# Patient Record
Sex: Female | Born: 1980
Health system: Southern US, Community
[De-identification: ages and names within clinical notes are randomized; demographics above are authoritative.]

## PROBLEM LIST (undated history)

## (undated) DIAGNOSIS — R011 Cardiac murmur, unspecified: Secondary | ICD-10-CM

## (undated) DIAGNOSIS — D560 Alpha thalassemia: Secondary | ICD-10-CM

## (undated) DIAGNOSIS — J45909 Unspecified asthma, uncomplicated: Secondary | ICD-10-CM

## (undated) DIAGNOSIS — Z1371 Encounter for nonprocreative screening for genetic disease carrier status: Secondary | ICD-10-CM

## (undated) DIAGNOSIS — Z1502 Genetic susceptibility to malignant neoplasm of ovary: Secondary | ICD-10-CM

## (undated) DIAGNOSIS — G43909 Migraine, unspecified, not intractable, without status migrainosus: Secondary | ICD-10-CM

## (undated) DIAGNOSIS — Z1501 Genetic susceptibility to malignant neoplasm of breast: Secondary | ICD-10-CM

## (undated) HISTORY — DX: Unspecified asthma, uncomplicated: J45.909

## (undated) HISTORY — DX: Migraine, unspecified, not intractable, without status migrainosus: G43.909

## (undated) HISTORY — PX: NO PAST SURGERIES: SHX2092

---

## 2015-03-06 ENCOUNTER — Encounter (HOSPITAL_COMMUNITY): Payer: Self-pay | Admitting: Emergency Medicine

## 2015-03-06 ENCOUNTER — Emergency Department (HOSPITAL_COMMUNITY)
Admission: EM | Admit: 2015-03-06 | Discharge: 2015-03-06 | Disposition: A | Discharge status: Home / Self Care | Payer: 59 | Source: Home / Self Care | Attending: Emergency Medicine | Admitting: Emergency Medicine

## 2015-03-06 DIAGNOSIS — J029 Acute pharyngitis, unspecified: Secondary | ICD-10-CM | POA: Diagnosis not present

## 2015-03-06 DIAGNOSIS — B349 Viral infection, unspecified: Secondary | ICD-10-CM | POA: Diagnosis not present

## 2015-03-06 LAB — POCT RAPID STREP A: Streptococcus, Group A Screen (Direct): NEGATIVE

## 2015-03-06 MED ORDER — AMOXICILLIN 500 MG PO CAPS: 500.0000 mg | ORAL_CAPSULE | Freq: Two times a day (BID) | ORAL | Status: DC

## 2015-03-06 NOTE — ED Provider Notes (Signed)
CSN: [id number]     Arrival date & time 03/06/15  1806 History   First MD Initiated Contact with Patient 03/06/15 1845     Chief Complaint  Patient presents with  . URI   (Consider location/radiation/quality/duration/timing/severity/associated sxs/prior Treatment) HPI  She is a 34 year old woman here for evaluation of sore throat. She states she has had sore throat, body aches, headache, ear ache, and a dry cough for the last 3 days. She does report spitting some blood-tinged mucus. She states she has felt not great for more like 1-2 weeks. She has not taken any medications. She does not report fevers at home.  History reviewed. No pertinent past medical history. History reviewed. No pertinent past surgical history. No family history on file. History  Substance Use Topics  . Smoking status: Not on file  . Smokeless tobacco: Not on file  . Alcohol Use: Not on file   OB History    No data available     Review of Systems As in history of present illness Allergies  Review of patient's allergies indicates no known allergies.  Home Medications   Prior to Admission medications   Medication Sig Start Date End Date Taking? Authorizing Provider  amoxicillin (AMOXIL) 500 MG capsule Take 1 capsule (500 mg total) by mouth 2 (two) times daily. 03/06/15   Charm Rings, MD   BP 119/74 mmHg  Pulse 120  Temp(Src) 103 F (39.4 C) (Oral)  Resp 16  SpO2 100%  LMP 02/16/2015 Physical Exam  Constitutional: She is oriented to person, place, and time. She appears well-developed and well-nourished. No distress.  HENT:  Nose: Nose normal.  Mouth/Throat: No oropharyngeal exudate.  Bilateral ears with clear middle ear effusions. Oropharynx is mildly erythematous. The right tonsil appears friable. No exudate.  Neck: Neck supple.  Cardiovascular: Regular rhythm and normal heart sounds.   No murmur heard. Tachycardic, appropriate for fever  Pulmonary/Chest: Effort normal and breath sounds normal. No  respiratory distress. She has no wheezes. She has no rales.  Lymphadenopathy:    She has no cervical adenopathy.  Neurological: She is alert and oriented to person, place, and time.    ED Course  Procedures (including critical care time) Labs Review Labs Reviewed  POCT RAPID STREP A    Imaging Review No results found.   MDM   1. Pharyngitis   2. Viral illness    This is likely viral. Discussed symptomatic treatment. Prescription for amoxicillin given to be filled if no improvement in 2 days.    Charm Rings, MD 03/06/15 [phone]

## 2015-03-06 NOTE — Discharge Instructions (Signed)
You likely has a virus. Take Tylenol or ibuprofen as needed. Make sure you're drinking plenty of fluids. If your symptoms are not improving by Thursday, please fill the amoxicillin. Follow-up as needed.

## 2015-03-06 NOTE — ED Notes (Signed)
C/o cold sx onset 3 days Sx include ear pain, HA, BA, ST, dry cough Alert... No signs of acute distress.

## 2015-03-10 LAB — CULTURE, GROUP A STREP: Strep A Culture: NEGATIVE

## 2015-07-29 NOTE — L&D Delivery Note (Addendum)
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  APGAR: 8, 9; weight  .   Placenta status: 3 vessel cord. Membranes intact.   Anesthesia:  Epidural Episiotomy: None Lacerations: 2nd degree;Perineal Suture Repair: 2.0 vicryl Est. Blood Loss (mL):    Mom to postpartum.  Baby to Couplet care / Skin to Skin.  Circumcision discussed. Patient wants to be discharged after 24 hours.  Janine LimboSTRINGER,Lety Cullens V 03/08/2016, 5:32 PM

## 2015-08-14 DIAGNOSIS — R946 Abnormal results of thyroid function studies: Secondary | ICD-10-CM | POA: Diagnosis not present

## 2015-08-14 DIAGNOSIS — Z3480 Encounter for supervision of other normal pregnancy, unspecified trimester: Secondary | ICD-10-CM | POA: Diagnosis not present

## 2015-08-14 LAB — OB RESULTS CONSOLE RPR: RPR: NONREACTIVE

## 2015-08-14 LAB — OB RESULTS CONSOLE HEPATITIS B SURFACE ANTIGEN: HEP B S AG: NEGATIVE

## 2015-08-14 LAB — OB RESULTS CONSOLE ABO/RH: RH Type: POSITIVE

## 2015-08-14 LAB — OB RESULTS CONSOLE ANTIBODY SCREEN: Antibody Screen: NEGATIVE

## 2015-08-14 LAB — OB RESULTS CONSOLE RUBELLA ANTIBODY, IGM: Rubella: IMMUNE

## 2015-08-21 DIAGNOSIS — O26899 Other specified pregnancy related conditions, unspecified trimester: Secondary | ICD-10-CM | POA: Diagnosis not present

## 2015-08-21 DIAGNOSIS — Z3A09 9 weeks gestation of pregnancy: Secondary | ICD-10-CM | POA: Diagnosis not present

## 2015-08-27 ENCOUNTER — Other Ambulatory Visit: Payer: Self-pay | Admitting: Obstetrics and Gynecology

## 2015-08-27 ENCOUNTER — Other Ambulatory Visit (HOSPITAL_COMMUNITY)
Admission: RE | Admit: 2015-08-27 | Discharge: 2015-08-27 | Disposition: A | Discharge status: Home / Self Care | Payer: 59 | Source: Ambulatory Visit | Attending: Obstetrics and Gynecology | Admitting: Obstetrics and Gynecology

## 2015-08-27 DIAGNOSIS — Z3480 Encounter for supervision of other normal pregnancy, unspecified trimester: Secondary | ICD-10-CM | POA: Diagnosis not present

## 2015-08-27 DIAGNOSIS — Z01419 Encounter for gynecological examination (general) (routine) without abnormal findings: Secondary | ICD-10-CM | POA: Diagnosis not present

## 2015-08-27 DIAGNOSIS — Z1151 Encounter for screening for human papillomavirus (HPV): Secondary | ICD-10-CM | POA: Diagnosis not present

## 2015-08-27 DIAGNOSIS — D560 Alpha thalassemia: Secondary | ICD-10-CM | POA: Diagnosis not present

## 2015-08-27 DIAGNOSIS — R946 Abnormal results of thyroid function studies: Secondary | ICD-10-CM | POA: Diagnosis not present

## 2015-08-29 LAB — CYTOLOGY - PAP

## 2015-09-17 DIAGNOSIS — D560 Alpha thalassemia: Secondary | ICD-10-CM | POA: Diagnosis not present

## 2015-09-17 DIAGNOSIS — Z3482 Encounter for supervision of other normal pregnancy, second trimester: Secondary | ICD-10-CM | POA: Diagnosis not present

## 2015-09-17 MED FILL — SELECT-OB + DHA PACK: 29-1 & 250 | 30 days supply | Qty: 60 | Fill #0

## 2015-09-25 MED FILL — BUTALBITAL/APAP/CAFFEINE TB: 50-325-40 | 10 days supply | Qty: 30 | Fill #0

## 2015-10-15 DIAGNOSIS — Z3482 Encounter for supervision of other normal pregnancy, second trimester: Secondary | ICD-10-CM | POA: Diagnosis not present

## 2015-10-29 DIAGNOSIS — Z3A20 20 weeks gestation of pregnancy: Secondary | ICD-10-CM | POA: Diagnosis not present

## 2015-10-29 DIAGNOSIS — Z3482 Encounter for supervision of other normal pregnancy, second trimester: Secondary | ICD-10-CM | POA: Diagnosis not present

## 2015-10-29 DIAGNOSIS — Z36 Encounter for antenatal screening of mother: Secondary | ICD-10-CM | POA: Diagnosis not present

## 2015-12-10 DIAGNOSIS — Z3482 Encounter for supervision of other normal pregnancy, second trimester: Secondary | ICD-10-CM | POA: Diagnosis not present

## 2016-01-08 DIAGNOSIS — Z3482 Encounter for supervision of other normal pregnancy, second trimester: Secondary | ICD-10-CM | POA: Diagnosis not present

## 2016-01-08 DIAGNOSIS — Z23 Encounter for immunization: Secondary | ICD-10-CM | POA: Diagnosis not present

## 2016-01-08 DIAGNOSIS — O99012 Anemia complicating pregnancy, second trimester: Secondary | ICD-10-CM | POA: Diagnosis not present

## 2016-01-23 ENCOUNTER — Other Ambulatory Visit: Payer: Self-pay | Admitting: Obstetrics and Gynecology

## 2016-01-23 ENCOUNTER — Ambulatory Visit (HOSPITAL_COMMUNITY)
Admission: RE | Admit: 2016-01-23 | Discharge: 2016-01-23 | Disposition: A | Discharge status: Home / Self Care | Payer: 59 | Source: Ambulatory Visit | Attending: Obstetrics and Gynecology | Admitting: Obstetrics and Gynecology

## 2016-01-23 DIAGNOSIS — O26843 Uterine size-date discrepancy, third trimester: Secondary | ICD-10-CM | POA: Insufficient documentation

## 2016-01-23 DIAGNOSIS — Z36 Encounter for antenatal screening of mother: Secondary | ICD-10-CM | POA: Insufficient documentation

## 2016-01-23 DIAGNOSIS — Z3689 Encounter for other specified antenatal screening: Secondary | ICD-10-CM

## 2016-01-23 DIAGNOSIS — Z3A32 32 weeks gestation of pregnancy: Secondary | ICD-10-CM | POA: Insufficient documentation

## 2016-02-22 DIAGNOSIS — Z3483 Encounter for supervision of other normal pregnancy, third trimester: Secondary | ICD-10-CM | POA: Diagnosis not present

## 2016-02-22 LAB — OB RESULTS CONSOLE GBS: STREP GROUP B AG: NEGATIVE

## 2016-03-05 ENCOUNTER — Telehealth (HOSPITAL_COMMUNITY): Payer: Self-pay | Admitting: *Deleted

## 2016-03-05 NOTE — Telephone Encounter (Signed)
Preadmission screen  

## 2016-03-08 ENCOUNTER — Inpatient Hospital Stay (HOSPITAL_COMMUNITY): Payer: 59 | Admitting: Anesthesiology

## 2016-03-08 ENCOUNTER — Encounter (HOSPITAL_COMMUNITY): Payer: Self-pay | Admitting: *Deleted

## 2016-03-08 ENCOUNTER — Inpatient Hospital Stay (HOSPITAL_COMMUNITY)
Admission: AD | Admit: 2016-03-08 | Discharge: 2016-03-09 | DRG: 775 | Disposition: A | Discharge status: Home / Self Care | Payer: 59 | Source: Ambulatory Visit | Attending: Obstetrics and Gynecology | Admitting: Obstetrics and Gynecology

## 2016-03-08 DIAGNOSIS — Z8249 Family history of ischemic heart disease and other diseases of the circulatory system: Secondary | ICD-10-CM

## 2016-03-08 DIAGNOSIS — IMO0001 Reserved for inherently not codable concepts without codable children: Secondary | ICD-10-CM

## 2016-03-08 DIAGNOSIS — D560 Alpha thalassemia: Secondary | ICD-10-CM

## 2016-03-08 DIAGNOSIS — Z833 Family history of diabetes mellitus: Secondary | ICD-10-CM | POA: Diagnosis not present

## 2016-03-08 DIAGNOSIS — Z3A39 39 weeks gestation of pregnancy: Secondary | ICD-10-CM

## 2016-03-08 DIAGNOSIS — Z3483 Encounter for supervision of other normal pregnancy, third trimester: Secondary | ICD-10-CM | POA: Diagnosis present

## 2016-03-08 HISTORY — DX: Cardiac murmur, unspecified: R01.1

## 2016-03-08 HISTORY — DX: Alpha thalassemia: D56.0

## 2016-03-08 LAB — CBC
HCT: 33.9 % — ABNORMAL LOW (ref 36.0–46.0)
HEMOGLOBIN: 11.1 g/dL — AB (ref 12.0–15.0)
MCH: 22.1 pg — AB (ref 26.0–34.0)
MCHC: 32.7 g/dL (ref 30.0–36.0)
MCV: 67.5 fL — AB (ref 78.0–100.0)
PLATELETS: 166 10*3/uL (ref 150–400)
RBC: 5.02 MIL/uL (ref 3.87–5.11)
RDW: 14.8 % (ref 11.5–15.5)
WBC: 12 10*3/uL — AB (ref 4.0–10.5)

## 2016-03-08 LAB — TYPE AND SCREEN
ABO/RH(D): O POS
Antibody Screen: NEGATIVE

## 2016-03-08 LAB — ABO/RH: ABO/RH(D): O POS

## 2016-03-08 MED ORDER — OXYTOCIN BOLUS FROM INFUSION
500.0000 mL | Freq: Once | INTRAVENOUS | Status: AC
  Administered 2016-03-08: 500 mL via INTRAVENOUS

## 2016-03-08 MED ORDER — HYDROMORPHONE HCL 2 MG PO TABS: 2.0000 mg | ORAL_TABLET | Freq: Four times a day (QID) | ORAL | Status: DC | PRN

## 2016-03-08 MED ORDER — PHENYLEPHRINE 40 MCG/ML (10ML) SYRINGE FOR IV PUSH (FOR BLOOD PRESSURE SUPPORT)
80.0000 ug | PREFILLED_SYRINGE | INTRAVENOUS | Status: DC | PRN
  Filled 2016-03-08: qty 10
  Filled 2016-03-08: qty 5

## 2016-03-08 MED ORDER — DEXTROSE 5 % IV SOLN
2.5000 10*6.[IU] | INTRAVENOUS | Status: DC
  Filled 2016-03-08 (×2): qty 2.5

## 2016-03-08 MED ORDER — TETANUS-DIPHTH-ACELL PERTUSSIS 5-2.5-18.5 LF-MCG/0.5 IM SUSP: 0.5000 mL | Freq: Once | INTRAMUSCULAR | Status: DC

## 2016-03-08 MED ORDER — ONDANSETRON HCL 4 MG/2ML IJ SOLN: 4.0000 mg | Freq: Four times a day (QID) | INTRAMUSCULAR | Status: DC | PRN

## 2016-03-08 MED ORDER — SOD CITRATE-CITRIC ACID 500-334 MG/5ML PO SOLN: 30.0000 mL | ORAL | Status: DC | PRN

## 2016-03-08 MED ORDER — MEASLES, MUMPS & RUBELLA VAC ~~LOC~~ INJ
0.5000 mL | INJECTION | Freq: Once | SUBCUTANEOUS | Status: DC
  Filled 2016-03-08: qty 0.5

## 2016-03-08 MED ORDER — LACTATED RINGERS IV SOLN
INTRAVENOUS | Status: DC
  Administered 2016-03-08: 15:00:00 via INTRAVENOUS

## 2016-03-08 MED ORDER — IBUPROFEN 600 MG PO TABS
600.0000 mg | ORAL_TABLET | Freq: Four times a day (QID) | ORAL | Status: DC
  Administered 2016-03-08 – 2016-03-09 (×5): 600 mg via ORAL
  Filled 2016-03-08 (×5): qty 1

## 2016-03-08 MED ORDER — COCONUT OIL OIL
1.0000 | TOPICAL_OIL | Status: DC | PRN
Start: 2016-03-08 — End: 2016-03-09

## 2016-03-08 MED ORDER — PHENYLEPHRINE 40 MCG/ML (10ML) SYRINGE FOR IV PUSH (FOR BLOOD PRESSURE SUPPORT)
80.0000 ug | PREFILLED_SYRINGE | INTRAVENOUS | Status: DC | PRN
  Filled 2016-03-08: qty 5

## 2016-03-08 MED ORDER — ONDANSETRON HCL 4 MG PO TABS: 4.0000 mg | ORAL_TABLET | ORAL | Status: DC | PRN

## 2016-03-08 MED ORDER — DIPHENHYDRAMINE HCL 25 MG PO CAPS: 25.0000 mg | ORAL_CAPSULE | Freq: Four times a day (QID) | ORAL | Status: DC | PRN

## 2016-03-08 MED ORDER — MEDROXYPROGESTERONE ACETATE 150 MG/ML IM SUSP: 150.0000 mg | INTRAMUSCULAR | Status: DC | PRN

## 2016-03-08 MED ORDER — FENTANYL 2.5 MCG/ML BUPIVACAINE 1/10 % EPIDURAL INFUSION (WH - ANES)
14.0000 mL/h | INTRAMUSCULAR | Status: DC | PRN
Start: 2016-03-08 — End: 2016-03-09
  Administered 2016-03-08 (×2): 14 mL/h via EPIDURAL
  Filled 2016-03-08 (×2): qty 125

## 2016-03-08 MED ORDER — OXYCODONE-ACETAMINOPHEN 5-325 MG PO TABS: 1.0000 | ORAL_TABLET | ORAL | Status: DC | PRN

## 2016-03-08 MED ORDER — OXYCODONE-ACETAMINOPHEN 5-325 MG PO TABS: 2.0000 | ORAL_TABLET | ORAL | Status: DC | PRN

## 2016-03-08 MED ORDER — LACTATED RINGERS IV SOLN: 500.0000 mL | INTRAVENOUS | Status: DC | PRN

## 2016-03-08 MED ORDER — OXYTOCIN 40 UNITS IN LACTATED RINGERS INFUSION - SIMPLE MED
2.5000 [IU]/h | INTRAVENOUS | Status: DC
  Filled 2016-03-08: qty 1000

## 2016-03-08 MED ORDER — PRENATAL MULTIVITAMIN CH
1.0000 | ORAL_TABLET | Freq: Every day | ORAL | Status: DC
  Administered 2016-03-09: 1 via ORAL
  Filled 2016-03-08: qty 1

## 2016-03-08 MED ORDER — PENICILLIN G POTASSIUM 5000000 UNITS IJ SOLR
5.0000 10*6.[IU] | Freq: Once | INTRAVENOUS | Status: DC
  Filled 2016-03-08: qty 5

## 2016-03-08 MED ORDER — SENNOSIDES-DOCUSATE SODIUM 8.6-50 MG PO TABS
2.0000 | ORAL_TABLET | ORAL | Status: DC
  Administered 2016-03-09: 2 via ORAL
  Filled 2016-03-08: qty 2

## 2016-03-08 MED ORDER — LACTATED RINGERS IV SOLN
500.0000 mL | Freq: Once | INTRAVENOUS | Status: AC
  Administered 2016-03-08: 500 mL via INTRAVENOUS

## 2016-03-08 MED ORDER — CEFAZOLIN SODIUM-DEXTROSE 2-4 GM/100ML-% IV SOLN: 2.0000 g | Freq: Once | INTRAVENOUS | Status: DC

## 2016-03-08 MED ORDER — ZOLPIDEM TARTRATE 5 MG PO TABS: 5.0000 mg | ORAL_TABLET | Freq: Every evening | ORAL | Status: DC | PRN

## 2016-03-08 MED ORDER — EPHEDRINE 5 MG/ML INJ
10.0000 mg | INTRAVENOUS | Status: DC | PRN
  Filled 2016-03-08: qty 4

## 2016-03-08 MED ORDER — ONDANSETRON HCL 4 MG/2ML IJ SOLN: 4.0000 mg | INTRAMUSCULAR | Status: DC | PRN

## 2016-03-08 MED ORDER — FLEET ENEMA 7-19 GM/118ML RE ENEM: 1.0000 | ENEMA | RECTAL | Status: DC | PRN

## 2016-03-08 MED ORDER — DIPHENHYDRAMINE HCL 50 MG/ML IJ SOLN: 12.5000 mg | INTRAMUSCULAR | Status: DC | PRN

## 2016-03-08 MED ORDER — WITCH HAZEL-GLYCERIN EX PADS: 1.0000 "application " | MEDICATED_PAD | CUTANEOUS | Status: DC | PRN

## 2016-03-08 MED ORDER — LIDOCAINE HCL (PF) 1 % IJ SOLN
30.0000 mL | INTRAMUSCULAR | Status: AC | PRN
  Administered 2016-03-08: 5 mL via SUBCUTANEOUS
  Filled 2016-03-08: qty 30

## 2016-03-08 MED ORDER — BENZOCAINE-MENTHOL 20-0.5 % EX AERO
1.0000 "application " | INHALATION_SPRAY | CUTANEOUS | Status: DC | PRN
  Administered 2016-03-09: 1 via TOPICAL
  Filled 2016-03-08: qty 56

## 2016-03-08 MED ORDER — CEFAZOLIN IN D5W 1 GM/50ML IV SOLN: 1.0000 g | Freq: Three times a day (TID) | INTRAVENOUS | Status: DC

## 2016-03-08 MED ORDER — ACETAMINOPHEN 325 MG PO TABS: 650.0000 mg | ORAL_TABLET | ORAL | Status: DC | PRN

## 2016-03-08 MED ORDER — DIBUCAINE 1 % RE OINT: 1.0000 "application " | TOPICAL_OINTMENT | RECTAL | Status: DC | PRN

## 2016-03-08 MED ORDER — SIMETHICONE 80 MG PO CHEW: 80.0000 mg | CHEWABLE_TABLET | ORAL | Status: DC | PRN

## 2016-03-08 NOTE — H&P (Signed)
[facility] Admission History and Physical Exam for an Obstetrics Patient  [name] is a 35 y.o. female, G3P2002, at 66w0dgestation, who presents for active labor. No SROM. [facility].  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 See history below.  OB History    Gravida Para Term Preterm AB Living   '3 2 2 '$ 0 0 2   SAB TAB Ectopic Multiple Live Births   0 0 0 0 2      Past Medical History:  Diagnosis Date  . Alpha thalassemia (HRankin   . Heart murmur    MVP    Prescriptions Prior to Admission  Medication Sig Dispense Refill Last Dose  . Prenatal Vit-Fe Fumarate-FA (PRENATAL MULTIVITAMIN) TABS tablet Take 1 tablet by mouth daily.   Past Week at Unknown time    Past Surgical History:  Procedure Laterality Date  . NO PAST SURGERIES      No Known Allergies  Family History: family history includes Cancer in her maternal grandmother, mother, paternal grandfather, and paternal grandmother; Diabetes in her father; Heart disease in her maternal grandfather.  Social History:  reports that she has never smoked. She has never used smokeless tobacco. She reports that she does not drink alcohol or use drugs.  Review of systems: Normal pregnancy complaints.  Admission Physical Exam:  Dilation: 7 Effacement (%): 100 Station: -3 Exam by:: jolynn There is no height or weight on file to calculate BMI.  There were no vitals taken for this visit.   HEENT:                 Within normal limits Chest:                   Clear Heart:                    Regular rate and rhythm Abdomen:             Gravid and nontender Extremities:          Grossly normal Neurologic exam: Grossly normal Pelvic exam:         Cervix: 7 cm  Prenatal labs: ABO, Rh:   O+            HBsAg:         NR           HIV:              NR            GBS:             Neg          Antibody:       Neg         Rubella:         Immune RPR:            NR                Prenatal Transfer Tool  Maternal Diabetes: No Genetic Screening: Normal AFP, Declines other screen Maternal Ultrasounds/Referrals: Normal Fetal Ultrasounds or other Referrals:  None Maternal Substance Abuse:  No Significant Maternal Medications:  None Significant Maternal Lab Results:  None Other Comments:  None  Assessment:  324w0destation  Active Labor  Alpha Thal  Plan:  Vaginal delivery.   Harlen Danford V 03/08/2016, 3:00 PM

## 2016-03-08 NOTE — Discharge Instructions (Signed)
Contraception Choices Contraception (birth control) is the use of any methods or devices to prevent pregnancy. Below are some methods to help avoid pregnancy. HORMONAL METHODS   Contraceptive implant. This is a thin, plastic tube containing progesterone hormone. It does not contain estrogen hormone. Your health care provider inserts the tube in the inner part of the upper arm. The tube can remain in place for up to 3 years. After 3 years, the implant must be removed. The implant prevents the ovaries from releasing an egg (ovulation), thickens the cervical mucus to prevent sperm from entering the uterus, and thins the lining of the inside of the uterus.  Progesterone-only injections. These injections are given every 3 months by your health care provider to prevent pregnancy. This synthetic progesterone hormone stops the ovaries from releasing eggs. It also thickens cervical mucus and changes the uterine lining. This makes it harder for sperm to survive in the uterus.  Birth control pills. These pills contain estrogen and progesterone hormone. They work by preventing the ovaries from releasing eggs (ovulation). They also cause the cervical mucus to thicken, preventing the sperm from entering the uterus. Birth control pills are prescribed by a health care provider.Birth control pills can also be used to treat heavy periods.  Minipill. This type of birth control pill contains only the progesterone hormone. They are taken every day of each month and must be prescribed by your health care provider.  Birth control patch. The patch contains hormones similar to those in birth control pills. It must be changed once a week and is prescribed by a health care provider.  Vaginal ring. The ring contains hormones similar to those in birth control pills. It is left in the vagina for 3 weeks, removed for 1 week, and then a new one is put back in place. The patient must be comfortable inserting and removing the ring  from the vagina.A health care provider's prescription is necessary.  Emergency contraception. Emergency contraceptives prevent pregnancy after unprotected sexual intercourse. This pill can be taken right after sex or up to 5 days after unprotected sex. It is most effective the sooner you take the pills after having sexual intercourse. Most emergency contraceptive pills are available without a prescription. Check with your pharmacist. Do not use emergency contraception as your only form of birth control. BARRIER METHODS   Female condom. This is a thin sheath (latex or rubber) that is worn over the penis during sexual intercourse. It can be used with spermicide to increase effectiveness.  Female condom. This is a soft, loose-fitting sheath that is put into the vagina before sexual intercourse.  Diaphragm. This is a soft, latex, dome-shaped barrier that must be fitted by a health care provider. It is inserted into the vagina, along with a spermicidal jelly. It is inserted before intercourse. The diaphragm should be left in the vagina for 6 to 8 hours after intercourse.  Cervical cap. This is a round, soft, latex or plastic cup that fits over the cervix and must be fitted by a health care provider. The cap can be left in place for up to 48 hours after intercourse.  Sponge. This is a soft, circular piece of polyurethane foam. The sponge has spermicide in it. It is inserted into the vagina after wetting it and before sexual intercourse.  Spermicides. These are chemicals that kill or block sperm from entering the cervix and uterus. They come in the form of creams, jellies, suppositories, foam, or tablets. They do not require a  prescription. They are inserted into the vagina with an applicator before having sexual intercourse. The process must be repeated every time you have sexual intercourse. INTRAUTERINE CONTRACEPTION  Intrauterine device (IUD). This is a T-shaped device that is put in a woman's uterus  during a menstrual period to prevent pregnancy. There are 2 types:  Copper IUD. This type of IUD is wrapped in copper wire and is placed inside the uterus. Copper makes the uterus and fallopian tubes produce a fluid that kills sperm. It can stay in place for 10 years.  Hormone IUD. This type of IUD contains the hormone progestin (synthetic progesterone). The hormone thickens the cervical mucus and prevents sperm from entering the uterus, and it also thins the uterine lining to prevent implantation of a fertilized egg. The hormone can weaken or kill the sperm that get into the uterus. It can stay in place for 3-5 years, depending on which type of IUD is used. PERMANENT METHODS OF CONTRACEPTION  Female tubal ligation. This is when the woman's fallopian tubes are surgically sealed, tied, or blocked to prevent the egg from traveling to the uterus.  Hysteroscopic sterilization. This involves placing a small coil or insert into each fallopian tube. Your doctor uses a technique called hysteroscopy to do the procedure. The device causes scar tissue to form. This results in permanent blockage of the fallopian tubes, so the sperm cannot fertilize the egg. It takes about 3 months after the procedure for the tubes to become blocked. You must use another form of birth control for these 3 months.  Female sterilization. This is when the female has the tubes that carry sperm tied off (vasectomy).This blocks sperm from entering the vagina during sexual intercourse. After the procedure, the man can still ejaculate fluid (semen). NATURAL PLANNING METHODS  Natural family planning. This is not having sexual intercourse or using a barrier method (condom, diaphragm, cervical cap) on days the woman could become pregnant.  Calendar method. This is keeping track of the length of each menstrual cycle and identifying when you are fertile.  Ovulation method. This is avoiding sexual intercourse during ovulation.  Symptothermal  method. This is avoiding sexual intercourse during ovulation, using a thermometer and ovulation symptoms.  Post-ovulation method. This is timing sexual intercourse after you have ovulated. Regardless of which type or method of contraception you choose, it is important that you use condoms to protect against the transmission of sexually transmitted infections (STIs). Talk with your health care provider about which form of contraception is most appropriate for you.   This information is not intended to replace advice given to you by your health care provider. Make sure you discuss any questions you have with your health care provider.   Document Released: 07/14/2005 Document Revised: 07/19/2013 Document Reviewed: 01/06/2013 Elsevier Interactive Patient Education 2016 [employer] Breastfeeding and Mastitis Mastitis is inflammation of the breast tissue. It can occur in women who are breastfeeding. This can make breastfeeding painful. Mastitis will sometimes go away on its own. Your health care provider will help determine if treatment is needed. CAUSES Mastitis is often associated with a blocked milk (lactiferous) duct. This can happen when too much milk builds up in the breast. Causes of excess milk in the breast can include:  Poor latch-on. If your baby is not latched onto the breast properly, she or he may not empty your breast completely while breastfeeding.  Allowing too much time to pass between feedings.  Wearing a bra or other clothing that is  too tight. This puts extra pressure on the lactiferous ducts so milk does not flow through them as it should. Mastitis can also be caused by a bacterial infection. Bacteria may enter the breast tissue through cuts or openings in the skin. In women who are breastfeeding, this may occur because of cracked or irritated skin. Cracks in the skin are often caused when your baby does not latch on properly to the breast. SIGNS AND SYMPTOMS  Swelling,  redness, tenderness, and pain in an area of the breast.  Swelling of the glands under the arm on the same side.  Fever may or may not accompany mastitis. If an infection is allowed to progress, a collection of pus (abscess) may develop. DIAGNOSIS  Your health care provider can usually diagnose mastitis based on your symptoms and a physical exam. Tests may be done to help confirm the diagnosis. These may include:  Removal of pus from the breast by applying pressure to the area. This pus can be examined in the lab to determine which bacteria are present. If an abscess has developed, the fluid in the abscess can be removed with a needle. This can also be used to confirm the diagnosis and determine the bacteria present. In most cases, pus will not be present.  Blood tests to determine if your body is fighting a bacterial infection.  Mammogram or ultrasound tests to rule out other problems or diseases. TREATMENT  Mastitis that occurs with breastfeeding will sometimes go away on its own. Your health care provider may choose to wait 24 hours after first seeing you to decide whether a prescription medicine is needed. If your symptoms are worse after 24 hours, your health care provider will likely prescribe an antibiotic medicine to treat the mastitis. He or she will determine which bacteria are most likely causing the infection and will then select an appropriate antibiotic medicine. This is sometimes changed based on the results of tests performed to identify the bacteria, or if there is no response to the antibiotic medicine selected. Antibiotic medicines are usually given by mouth. You may also be given medicine for pain. HOME CARE INSTRUCTIONS  Only take over-the-counter or prescription medicines for pain, fever, or discomfort as directed by your health care provider.  If your health care provider prescribed an antibiotic medicine, take the medicine as directed. Make sure you finish it even if you  start to feel better.  Do not wear a tight or underwire bra. Wear a soft, supportive bra.  Increase your fluid intake, especially if you have a fever.  Continue to empty the breast. Your health care provider can tell you whether this milk is safe for your infant or needs to be thrown out. You may be told to stop nursing until your health care provider thinks it is safe for your baby. Use a breast pump if you are advised to stop nursing.  Keep your nipples clean and dry.  Empty the first breast completely before going to the other breast. If your baby is not emptying your breasts completely for some reason, use a breast pump to empty your breasts.  If you go back to work, pump your breasts while at work to stay in time with your nursing schedule.  Avoid allowing your breasts to become overly filled with milk (engorged). SEEK MEDICAL CARE IF:  You have pus-like discharge from the breast.  Your symptoms do not improve with the treatment prescribed by your health care provider within 2 days. SEEK IMMEDIATE  MEDICAL CARE IF:  Your pain and swelling are getting worse.  You have pain that is not controlled with medicine.  You have a red line extending from the breast toward your armpit.  You have a fever or persistent symptoms for more than 2-3 days.  You have a fever and your symptoms suddenly get worse. MAKE SURE YOU:   Understand these instructions.  Will watch your condition.  Will get help right away if you are not doing well or get worse.   This information is not intended to replace advice given to you by your health care provider. Make sure you discuss any questions you have with your health care provider.   Document Released: 11/08/2004 Document Revised: 07/19/2013 Document Reviewed: 02/17/2013 Elsevier Interactive Patient Education [employer]. Breastfeeding Deciding to breastfeed is one of the best choices you can make for you and your baby. A change in hormones  during pregnancy causes your breast tissue to grow and increases the number and size of your milk ducts. These hormones also allow proteins, sugars, and fats from your blood supply to make breast milk in your milk-producing glands. Hormones prevent breast milk from being released before your baby is born as well as prompt milk flow after birth. Once breastfeeding has begun, thoughts of your baby, as well as his or her sucking or crying, can stimulate the release of milk from your milk-producing glands.  BENEFITS OF BREASTFEEDING For Your Baby  Your first milk (colostrum) helps your baby's digestive system function better.  There are antibodies in your milk that help your baby fight off infections.  Your baby has a lower incidence of asthma, allergies, and sudden infant death syndrome.  The nutrients in breast milk are better for your baby than infant formulas and are designed uniquely for your baby's needs.  Breast milk improves your baby's brain development.  Your baby is less likely to develop other conditions, such as childhood obesity, asthma, or type 2 diabetes mellitus. For You  Breastfeeding helps to create a very special bond between you and your baby.  Breastfeeding is convenient. Breast milk is always available at the correct temperature and costs nothing.  Breastfeeding helps to burn calories and helps you lose the weight gained during pregnancy.  Breastfeeding makes your uterus contract to its prepregnancy size faster and slows bleeding (lochia) after you give birth.   Breastfeeding helps to lower your risk of developing type 2 diabetes mellitus, osteoporosis, and breast or ovarian cancer later in life. SIGNS THAT YOUR BABY IS HUNGRY Early Signs of Hunger  Increased alertness or activity.  Stretching.  Movement of the head from side to side.  Movement of the head and opening of the mouth when the corner of the mouth or cheek is stroked (rooting).  Increased sucking  sounds, smacking lips, cooing, sighing, or squeaking.  Hand-to-mouth movements.  Increased sucking of fingers or hands. Late Signs of Hunger  Fussing.  Intermittent crying. Extreme Signs of Hunger Signs of extreme hunger will require calming and consoling before your baby will be able to breastfeed successfully. Do not wait for the following signs of extreme hunger to occur before you initiate breastfeeding:  Restlessness.  A loud, strong cry.  Screaming. BREASTFEEDING BASICS Breastfeeding Initiation  Find a comfortable place to sit or lie down, with your neck and back well supported.  Place a pillow or rolled up blanket under your baby to bring him or her to the level of your breast (if you are  seated). Nursing pillows are specially designed to help support your arms and your baby while you breastfeed.  Make sure that your baby's abdomen is facing your abdomen.  Gently massage your breast. With your fingertips, massage from your chest wall toward your nipple in a circular motion. This encourages milk flow. You may need to continue this action during the feeding if your milk flows slowly.  Support your breast with 4 fingers underneath and your thumb above your nipple. Make sure your fingers are well away from your nipple and your baby's mouth.  Stroke your baby's lips gently with your finger or nipple.  When your baby's mouth is open wide enough, quickly bring your baby to your breast, placing your entire nipple and as much of the colored area around your nipple (areola) as possible into your baby's mouth.  More areola should be visible above your baby's upper lip than below the lower lip.  Your baby's tongue should be between his or her lower gum and your breast.  Ensure that your baby's mouth is correctly positioned around your nipple (latched). Your baby's lips should create a seal on your breast and be turned out (everted).  It is common for your baby to suck about 2-3  minutes in order to start the flow of breast milk. Latching Teaching your baby how to latch on to your breast properly is very important. An improper latch can cause nipple pain and decreased milk supply for you and poor weight gain in your baby. Also, if your baby is not latched onto your nipple properly, he or she may swallow some air during feeding. This can make your baby fussy. Burping your baby when you switch breasts during the feeding can help to get rid of the air. However, teaching your baby to latch on properly is still the best way to prevent fussiness from swallowing air while breastfeeding. Signs that your baby has successfully latched on to your nipple:  Silent tugging or silent sucking, without causing you pain.  Swallowing heard between every 3-4 sucks.  Muscle movement above and in front of his or her ears while sucking. Signs that your baby has not successfully latched on to nipple:  Sucking sounds or smacking sounds from your baby while breastfeeding.  Nipple pain. If you think your baby has not latched on correctly, slip your finger into the corner of your baby's mouth to break the suction and place it between your baby's gums. Attempt breastfeeding initiation again. Signs of Successful Breastfeeding Signs from your baby:  A gradual decrease in the number of sucks or complete cessation of sucking.  Falling asleep.  Relaxation of his or her body.  Retention of a small amount of milk in his or her mouth.  Letting go of your breast by himself or herself. Signs from you:  Breasts that have increased in firmness, weight, and size 1-3 hours after feeding.  Breasts that are softer immediately after breastfeeding.  Increased milk volume, as well as a change in milk consistency and color by the fifth day of breastfeeding.  Nipples that are not sore, cracked, or bleeding. Signs That Your [employer] is Getting Enough Milk  Wetting at least 3 diapers in a 24-hour period. The  urine should be clear and pale yellow by age 105 days.  At least 3 stools in a 24-hour period by age 105 days. The stool should be soft and yellow.  At least 3 stools in a 24-hour period by age 105 days. The  stool should be seedy and yellow.  No loss of weight greater than 10% of birth weight during the first 34 days of age.  Average weight gain of 4-7 ounces (113-198 g) per week after age 18 days.  Consistent daily weight gain by age 7 days, without weight loss after the age of 2 weeks. After a feeding, your baby may spit up a small amount. This is common. BREASTFEEDING FREQUENCY AND DURATION Frequent feeding will help you make more milk and can prevent sore nipples and breast engorgement. Breastfeed when you feel the need to reduce the fullness of your breasts or when your baby shows signs of hunger. This is called "breastfeeding on demand." Avoid introducing a pacifier to your baby while you are working to establish breastfeeding (the first 4-6 weeks after your baby is born). After this time you may choose to use a pacifier. Research has shown that pacifier use during the first year of a baby's life decreases the risk of sudden infant death syndrome (SIDS). Allow your baby to feed on each breast as long as he or she wants. Breastfeed until your baby is finished feeding. When your baby unlatches or falls asleep while feeding from the first breast, offer the second breast. Because newborns are often sleepy in the first few weeks of life, you may need to awaken your baby to get him or her to feed. Breastfeeding times will vary from baby to baby. However, the following rules can serve as a guide to help you ensure that your baby is properly fed:  Newborns (babies 11 weeks of age or younger) may breastfeed every 1-3 hours.  Newborns should not go longer than 3 hours during the day or 5 hours during the night without breastfeeding.  You should breastfeed your baby a minimum of 8 times in a 24-hour period  until you begin to introduce solid foods to your baby at around 37 months of age. BREAST MILK PUMPING Pumping and storing breast milk allows you to ensure that your baby is exclusively fed your breast milk, even at times when you are unable to breastfeed. This is especially important if you are going back to work while you are still breastfeeding or when you are not able to be present during feedings. Your lactation consultant can give you guidelines on how long it is safe to store breast milk. A breast pump is a machine that allows you to pump milk from your breast into a sterile bottle. The pumped breast milk can then be stored in a refrigerator or freezer. Some breast pumps are operated by hand, while others use electricity. Ask your lactation consultant which type will work best for you. Breast pumps can be purchased, but some hospitals and breastfeeding support groups lease breast pumps on a monthly basis. A lactation consultant can teach you how to hand express breast milk, if you prefer not to use a pump. CARING FOR YOUR BREASTS WHILE YOU BREASTFEED Nipples can become dry, cracked, and sore while breastfeeding. The following recommendations can help keep your breasts moisturized and healthy:  Avoid using soap on your nipples.  Wear a supportive bra. Although not required, special nursing bras and tank tops are designed to allow access to your breasts for breastfeeding without taking off your entire bra or top. Avoid wearing underwire-style bras or extremely tight bras.  Air dry your nipples for 3-43mnutes after each feeding.  Use only cotton bra pads to absorb leaked breast milk. Leaking of breast milk between feedings  is normal.  Use lanolin on your nipples after breastfeeding. Lanolin helps to maintain your skin's normal moisture barrier. If you use pure lanolin, you do not need to wash it off before feeding your baby again. Pure lanolin is not toxic to your baby. You may also hand express a  few drops of breast milk and gently massage that milk into your nipples and allow the milk to air dry. In the first few weeks after giving birth, some women experience extremely full breasts (engorgement). Engorgement can make your breasts feel heavy, warm, and tender to the touch. Engorgement peaks within 3-5 days after you give birth. The following recommendations can help ease engorgement:  Completely empty your breasts while breastfeeding or pumping. You may want to start by applying warm, moist heat (in the shower or with warm water-soaked hand towels) just before feeding or pumping. This increases circulation and helps the milk flow. If your baby does not completely empty your breasts while breastfeeding, pump any extra milk after he or she is finished.  Wear a snug bra (nursing or regular) or tank top for 1-2 days to signal your body to slightly decrease milk production.  Apply ice packs to your breasts, unless this is too uncomfortable for you.  Make sure that your baby is latched on and positioned properly while breastfeeding. If engorgement persists after 48 hours of following these recommendations, contact your health care provider or a Science writer. OVERALL HEALTH CARE RECOMMENDATIONS WHILE BREASTFEEDING  Eat healthy foods. Alternate between meals and snacks, eating 3 of each per day. Because what you eat affects your breast milk, some of the foods may make your baby more irritable than usual. Avoid eating these foods if you are sure that they are negatively affecting your baby.  Drink milk, fruit juice, and water to satisfy your thirst (about 10 glasses a day).  Rest often, relax, and continue to take your prenatal vitamins to prevent fatigue, stress, and anemia.  Continue breast self-awareness checks.  Avoid chewing and smoking tobacco. Chemicals from cigarettes that pass into breast milk and exposure to secondhand smoke may harm your baby.  Avoid alcohol and drug use,  including marijuana. Some medicines that may be harmful to your baby can pass through breast milk. It is important to ask your health care provider before taking any medicine, including all over-the-counter and prescription medicine as well as vitamin and herbal supplements. It is possible to become pregnant while breastfeeding. If birth control is desired, ask your health care provider about options that will be safe for your baby. SEEK MEDICAL CARE IF:  You feel like you want to stop breastfeeding or have become frustrated with breastfeeding.  You have painful breasts or nipples.  Your nipples are cracked or bleeding.  Your breasts are red, tender, or warm.  You have a swollen area on either breast.  You have a fever or chills.  You have nausea or vomiting.  You have drainage other than breast milk from your nipples.  Your breasts do not become full before feedings by the fifth day after you give birth.  You feel sad and depressed.  Your baby is too sleepy to eat well.  Your baby is having trouble sleeping.   Your baby is wetting less than 3 diapers in a 24-hour period.  Your baby has less than 3 stools in a 24-hour period.  Your baby's skin or the white part of his or her eyes becomes yellow.   Your baby is  not gaining weight by 69 days of age. SEEK IMMEDIATE MEDICAL CARE IF:  Your baby is overly tired (lethargic) and does not want to wake up and feed.  Your baby develops an unexplained fever.   This information is not intended to replace advice given to you by your health care provider. Make sure you discuss any questions you have with your health care provider.   Document Released: 07/14/2005 Document Revised: 04/04/2015 Document Reviewed: 01/05/2013 Elsevier Interactive Patient Education 2016 [employer]. Anemia, Nonspecific Anemia is a condition in which the concentration of red blood cells or hemoglobin in the blood is below normal. Hemoglobin is a substance  in red blood cells that carries oxygen to the tissues of the body. Anemia results in not enough oxygen reaching these tissues.  CAUSES  Common causes of anemia include:   Excessive bleeding. Bleeding may be internal or external. This includes excessive bleeding from periods (in women) or from the intestine.   Poor nutrition.   Chronic kidney, thyroid, and liver disease.  Bone marrow disorders that decrease red blood cell production.  Cancer and treatments for cancer.  HIV, AIDS, and their treatments.  Spleen problems that increase red blood cell destruction.  Blood disorders.  Excess destruction of red blood cells due to infection, medicines, and autoimmune disorders. SIGNS AND SYMPTOMS   Minor weakness.   Dizziness.   Headache.  Palpitations.   Shortness of breath, especially with exercise.   Paleness.  Cold sensitivity.  Indigestion.  Nausea.  Difficulty sleeping.  Difficulty concentrating. Symptoms may occur suddenly or they may develop slowly.  DIAGNOSIS  Additional blood tests are often needed. These help your health care provider determine the best treatment. Your health care provider will check your stool for blood and look for other causes of blood loss.  TREATMENT  Treatment varies depending on the cause of the anemia. Treatment can include:   Supplements of iron, vitamin O27, or folic acid.   Hormone medicines.   A blood transfusion. This may be needed if blood loss is severe.   Hospitalization. This may be needed if there is significant continual blood loss.   Dietary changes.  Spleen removal. HOME CARE INSTRUCTIONS Keep all follow-up appointments. It often takes many weeks to correct anemia, and having your health care provider check on your condition and your response to treatment is very important. SEEK IMMEDIATE MEDICAL CARE IF:   You develop extreme weakness, shortness of breath, or chest pain.   You become dizzy or have  trouble concentrating.  You develop heavy vaginal bleeding.   You develop a rash.   You have bloody or black, tarry stools.   You faint.   You vomit up blood.   You vomit repeatedly.   You have abdominal pain.  You have a fever or persistent symptoms for more than 2-3 days.   You have a fever and your symptoms suddenly get worse.   You are dehydrated.  MAKE SURE YOU:  Understand these instructions.  Will watch your condition.  Will get help right away if you are not doing well or get worse.   This information is not intended to replace advice given to you by your health care provider. Make sure you discuss any questions you have with your health care provider.   Document Released: 08/21/2004 Document Revised: 03/16/2013 Document Reviewed: 01/07/2013 Elsevier Interactive Patient Education 2016 [employer]. Postpartum Depression and Baby Blues The postpartum period begins right after the birth of a baby. During this  time, there is often a great amount of joy and excitement. It is also a time of many changes in the life of the parents. Regardless of how many times a mother gives birth, each child brings new challenges and dynamics to the family. It is not unusual to have feelings of excitement along with confusing shifts in moods, emotions, and thoughts. All mothers are at risk of developing postpartum depression or the "baby blues." These mood changes can occur right after giving birth, or they may occur many months after giving birth. The baby blues or postpartum depression can be mild or severe. Additionally, postpartum depression can go away rather quickly, or it can be a long-term condition.  CAUSES Raised hormone levels and the rapid drop in those levels are thought to be a main cause of postpartum depression and the baby blues. A number of hormones change during and after pregnancy. Estrogen and progesterone usually decrease right after the delivery of your baby.  The levels of thyroid hormone and various cortisol steroids also rapidly drop. Other factors that play a role in these mood changes include major life events and genetics.  RISK FACTORS If you have any of the following risks for the baby blues or postpartum depression, know what symptoms to watch out for during the postpartum period. Risk factors that may increase the likelihood of getting the baby blues or postpartum depression include:  Having a personal or family history of depression.   Having depression while being pregnant.   Having premenstrual mood issues or mood issues related to oral contraceptives.  Having a lot of life stress.   Having marital conflict.   Lacking a social support network.   Having a baby with special needs.   Having health problems, such as diabetes.  SIGNS AND SYMPTOMS Symptoms of baby blues include:  Brief changes in mood, such as going from extreme happiness to sadness.  Decreased concentration.   Difficulty sleeping.   Crying spells, tearfulness.   Irritability.   Anxiety.  Symptoms of postpartum depression typically begin within the first month after giving birth. These symptoms include:  Difficulty sleeping or excessive sleepiness.   Marked weight loss.   Agitation.   Feelings of worthlessness.   Lack of interest in activity or food.  Postpartum psychosis is a very serious condition and can be dangerous. Fortunately, it is rare. Displaying any of the following symptoms is cause for immediate medical attention. Symptoms of postpartum psychosis include:   Hallucinations and delusions.   Bizarre or disorganized behavior.   Confusion or disorientation.  DIAGNOSIS  A diagnosis is made by an evaluation of your symptoms. There are no medical or lab tests that lead to a diagnosis, but there are various questionnaires that a health care provider may use to identify those with the baby blues, postpartum depression, or  psychosis. Often, a screening tool called the [place] Postnatal Depression Scale is used to diagnose depression in the postpartum period.  TREATMENT The baby blues usually goes away on its own in 1-2 weeks. Social support is often all that is needed. You will be encouraged to get adequate sleep and rest. Occasionally, you may be given medicines to help you sleep.  Postpartum depression requires treatment because it can last several months or longer if it is not treated. Treatment may include individual or group therapy, medicine, or both to address any social, physiological, and psychological factors that may play a role in the depression. Regular exercise, a healthy diet, rest, and social  support may also be strongly recommended.  Postpartum psychosis is more serious and needs treatment right away. Hospitalization is often needed. HOME CARE INSTRUCTIONS  Get as much rest as you can. Nap when the baby sleeps.   Exercise regularly. Some women find yoga and walking to be beneficial.   Eat a balanced and nourishing diet.   Do little things that you enjoy. Have a cup of tea, take a bubble bath, read your favorite magazine, or listen to your favorite music.  Avoid alcohol.   Ask for help with household chores, cooking, grocery shopping, or running errands as needed. Do not try to do everything.   Talk to people close to you about how you are feeling. Get support from your partner, family members, friends, or other new moms.  Try to stay positive in how you think. Think about the things you are grateful for.   Do not spend a lot of time alone.   Only take over-the-counter or prescription medicine as directed by your health care provider.  Keep all your postpartum appointments.   Let your health care provider know if you have any concerns.  SEEK MEDICAL CARE IF: You are having a reaction to or problems with your medicine. SEEK IMMEDIATE MEDICAL CARE IF:  You have suicidal  feelings.   You think you may harm the baby or someone else. MAKE SURE YOU:  Understand these instructions.  Will watch your condition.  Will get help right away if you are not doing well or get worse.   This information is not intended to replace advice given to you by your health care provider. Make sure you discuss any questions you have with your health care provider.   Document Released: 04/17/2004 Document Revised: 07/19/2013 Document Reviewed: 04/25/2013 Elsevier Interactive Patient Education 2016 [employer]. Iron-Rich Diet Iron is a mineral that helps your body to produce hemoglobin. Hemoglobin is a protein in your red blood cells that carries oxygen to your body's tissues. Eating too little iron may cause you to feel weak and tired, and it can increase your risk for infection. Eating enough iron is necessary for your body's metabolism, muscle function, and nervous system. Iron is naturally found in many foods. It can also be added to foods or fortified in foods. There are two types of dietary iron:  Heme iron. Heme iron is absorbed by the body more easily than nonheme iron. Heme iron is found in meat, poultry, and fish.  Nonheme iron. Nonheme iron is found in dietary supplements, iron-fortified grains, beans, and vegetables. You may need to follow an iron-rich diet if:  You have been diagnosed with iron deficiency or iron-deficiency anemia.  You have a condition that prevents you from absorbing dietary iron, such as:  Infection in your intestines.  Celiac disease. This involves long-lasting (chronic) inflammation of your intestines.  You do not eat enough iron.  You eat a diet that is high in foods that impair iron absorption.  You have lost a lot of blood.  You have heavy bleeding during your menstrual cycle.  You are pregnant. WHAT IS MY PLAN? Your health care provider may help you to determine how much iron you need per day based on your condition. Generally,  when a person consumes sufficient amounts of iron in the diet, the following iron needs are met:  Men.  50-58 years old: 11 mg per day.  74-27 years old: 8 mg per day.  Women.   26-92 years old: 15 mg  per day.  12-47 years old: 18 mg per day.  Over 27 years old: 8 mg per day.  Pregnant women: 27 mg per day.  Breastfeeding women: 9 mg per day. WHAT DO I NEED TO KNOW ABOUT AN IRON-RICH DIET?  Eat fresh fruits and vegetables that are high in vitamin C along with foods that are high in iron. This will help increase the amount of iron that your body absorbs from food, especially with foods containing nonheme iron. Foods that are high in vitamin C include oranges, peppers, tomatoes, and mango.  Take iron supplements only as directed by your health care provider. Overdose of iron can be life-threatening. If you were prescribed iron supplements, take them with orange juice or a vitamin C supplement.  Cook foods in pots and pans that are made from iron.   Eat nonheme iron-containing foods alongside foods that are high in heme iron. This helps to improve your iron absorption.   Certain foods and drinks contain compounds that impair iron absorption. Avoid eating these foods in the same meal as iron-rich foods or with iron supplements. These include:  Coffee, black tea, and red wine.  Milk, dairy products, and foods that are high in calcium.  Beans, soybeans, and peas.  Whole grains.  When eating foods that contain both nonheme iron and compounds that impair iron absorption, follow these tips to absorb iron better.   Soak beans overnight before cooking.  Soak whole grains overnight and drain them before using.  Ferment flours before baking, such as using yeast in bread dough. WHAT FOODS CAN I EAT? Grains Iron-fortified breakfast cereal. Iron-fortified whole-wheat bread. Enriched rice. Sprouted grains. Vegetables Spinach. Potatoes with skin. Green peas. Broccoli. Red and  green bell peppers. Fermented vegetables. Fruits Prunes. Raisins. Oranges. Strawberries. Mango. Grapefruit. Meats and Other Protein Sources Beef liver. Oysters. Beef. Shrimp. [place]. Chicken. [place]. Sardines. Chickpeas. Nuts. Tofu. Beverages Tomato juice. Fresh orange juice. Prune juice. Hibiscus tea. Fortified instant breakfast shakes. Condiments Tahini. Fermented soy sauce. Sweets and Desserts Black-strap molasses.  Other Wheat germ. The items listed above may not be a complete list of recommended foods or beverages. Contact your dietitian for more options. WHAT FOODS ARE NOT RECOMMENDED? Grains Whole grains. Bran cereal. Bran flour. Oats. Vegetables Artichokes. Brussels sprouts. Kale. Fruits Blueberries. Raspberries. Strawberries. Figs. Meats and Other Protein Sources Soybeans. Products made from soy protein. Dairy Milk. Cream. Cheese. Yogurt. Cottage cheese. Beverages Coffee. Black tea. Red wine. Sweets and Desserts Cocoa. Chocolate. Ice cream. Other Basil. Oregano. Parsley. The items listed above may not be a complete list of foods and beverages to avoid. Contact your dietitian for more information.   This information is not intended to replace advice given to you by your health care provider. Make sure you discuss any questions you have with your health care provider.   Document Released: 02/25/2005 Document Revised: 08/04/2014 Document Reviewed: 02/08/2014 Elsevier Interactive Patient Education 2016 [place]. Postpartum Care After Vaginal Delivery After you deliver your newborn (postpartum period), the usual stay in the hospital is 24-72 hours. If there were problems with your labor or delivery, or if you have other medical problems, you might be in the hospital longer.  While you are in the hospital, you will receive help and instructions on how to care for yourself and your newborn during the postpartum period.  While you are in the  hospital:  Be sure to tell your nurses if you have pain or discomfort, as well as where you feel  the pain and what makes the pain worse.  If you had an incision made near your vagina (episiotomy) or if you had some tearing during delivery, the nurses may put ice packs on your episiotomy or tear. The ice packs may help to reduce the pain and swelling.  If you are breastfeeding, you may feel uncomfortable contractions of your uterus for a couple of weeks. This is normal. The contractions help your uterus get back to normal size.  It is normal to have some bleeding after delivery.  For the first 1-3 days after delivery, the flow is red and the amount may be similar to a period.  It is common for the flow to start and stop.  In the first few days, you may pass some small clots. Let your nurses know if you begin to pass large clots or your flow increases.  Do not  flush blood clots down the toilet before having the nurse look at them.  During the next 3-10 days after delivery, your flow should become more watery and pink or brown-tinged in color.  Ten to fourteen days after delivery, your flow should be a small amount of yellowish-white discharge.  The amount of your flow will decrease over the first few weeks after delivery. Your flow may stop in 6-8 weeks. Most women have had their flow stop by 12 weeks after delivery.  You should change your sanitary pads frequently.  Wash your hands thoroughly with soap and water for at least 20 seconds after changing pads, using the toilet, or before holding or feeding your newborn.  You should feel like you need to empty your bladder within the first 6-8 hours after delivery.  In case you become weak, lightheaded, or faint, call your nurse before you get out of bed for the first time and before you take a shower for the first time.  Within the first few days after delivery, your breasts may begin to feel tender and full. This is called engorgement.  Breast tenderness usually goes away within 48-72 hours after engorgement occurs. You may also notice milk leaking from your breasts. If you are not breastfeeding, do not stimulate your breasts. Breast stimulation can make your breasts produce more milk.  Spending as much time as possible with your newborn is very important. During this time, you and your newborn can feel close and get to know each other. Having your newborn stay in your room (rooming in) will help to strengthen the bond with your newborn. It will give you time to get to know your newborn and become comfortable caring for your newborn.  Your hormones change after delivery. Sometimes the hormone changes can temporarily cause you to feel sad or tearful. These feelings should not last more than a few days. If these feelings last longer than that, you should talk to your caregiver.  If desired, talk to your caregiver about methods of family planning or contraception.  Talk to your caregiver about immunizations. Your caregiver may want you to have the following immunizations before leaving the hospital:  Tetanus, diphtheria, and pertussis (Tdap) or tetanus and diphtheria (Td) immunization. It is very important that you and your family (including grandparents) or others caring for your newborn are up-to-date with the Tdap or Td immunizations. The Tdap or Td immunization can help protect your newborn from getting ill.  Rubella immunization.  Varicella (chickenpox) immunization.  Influenza immunization. You should receive this annual immunization if you did not receive the immunization during your  pregnancy.   This information is not intended to replace advice given to you by your health care provider. Make sure you discuss any questions you have with your health care provider.   Document Released: 05/11/2007 Document Revised: 04/07/2012 Document Reviewed: 03/10/2012 Elsevier Interactive Patient Education [employer].

## 2016-03-08 NOTE — Anesthesia Postprocedure Evaluation (Signed)
Anesthesia Post Note  Patient: Lorraine West  Procedure(s) Performed: * No procedures listed *  Patient location during evaluation: Mother Baby Anesthesia Type: Epidural Level of consciousness: awake and alert Pain management: pain level controlled Vital Signs Assessment: post-procedure vital signs reviewed and stable Respiratory status: spontaneous breathing, nonlabored ventilation and respiratory function stable Cardiovascular status: stable Postop Assessment: no headache, no backache and epidural receding Anesthetic complications: no Comments: Pt had almost no pain relief from this epidural.  I had noted that on insertion the epidural space seemed very tight, with a large amount of anesthetic fluid coming back through the needle spontaneously prior to catheter insertion. However, the catheter was passed without difficulty and the test dose given through felt normal.  Finally, after an appropriate time, there was only minor relief.  I decided to remove and replace the epidural catheter at a higher interspace.     Last Vitals:  Vitals:   03/08/16 1540 03/08/16 1601  BP:    Pulse:    Resp: 20 (!) 24  Temp: 36.8 C     Last Pain:  Vitals:   03/08/16 1635  TempSrc:   PainSc: 2    Pain Goal:                 Eydan Chianese A

## 2016-03-08 NOTE — Anesthesia Preprocedure Evaluation (Signed)
Anesthesia Evaluation  Patient identified by MRN, date of birth, ID band Patient awake    Reviewed: Allergy & Precautions, NPO status , Patient's Chart, lab work & pertinent test results  Airway Mallampati: I  TM Distance: >3 FB Neck ROM: Full    Dental  (+) Dental Advisory Given, Teeth Intact   Pulmonary    breath sounds clear to auscultation       Cardiovascular  Rhythm:Regular Rate:Normal     Neuro/Psych    GI/Hepatic   Endo/Other    Renal/GU      Musculoskeletal   Abdominal   Peds  Hematology   Anesthesia Other Findings   Reproductive/Obstetrics (+) Pregnancy                             Anesthesia Physical Anesthesia Plan  ASA: II  Anesthesia Plan: Epidural   Post-op Pain Management:    Induction:   Airway Management Planned:   Additional Equipment:   Intra-op Plan:   Post-operative Plan:   Informed Consent: I have reviewed the patients History and Physical, chart, labs and discussed the procedure including the risks, benefits and alternatives for the proposed anesthesia with the patient or authorized representative who has indicated his/her understanding and acceptance.     Plan Discussed with: Anesthesiologist  Anesthesia Plan Comments:         Anesthesia Quick Evaluation

## 2016-03-08 NOTE — Anesthesia Procedure Notes (Signed)
Epidural Patient location during procedure: OB Start time: 03/08/2016 4:02 PM  Staffing Anesthesiologist: Vidya Bamford  Preanesthetic Checklist Completed: patient identified, site marked, surgical consent, pre-op evaluation, timeout performed, IV checked, risks and benefits discussed and monitors and equipment checked  Epidural Patient position: sitting Prep: site prepped and draped and DuraPrep Patient monitoring: continuous pulse ox and blood pressure Approach: midline Location: L2-L3 Injection technique: LOR air  Needle:  Needle type: Tuohy  Needle gauge: 17 G Needle length: 9 cm and 9 Needle insertion depth: 5 cm cm Catheter type: closed end flexible Catheter size: 19 Gauge Catheter at skin depth: 10 cm Test dose: negative  Assessment Events: blood not aspirated, injection not painful, no injection resistance, negative IV test and no paresthesia

## 2016-03-08 NOTE — Anesthesia Procedure Notes (Signed)
Epidural Patient location during procedure: OB Start time: 03/08/2016 3:20 PM  Staffing Anesthesiologist: Jaimey Franchini  Preanesthetic Checklist Completed: patient identified, site marked, surgical consent, pre-op evaluation, timeout performed, IV checked, risks and benefits discussed and monitors and equipment checked  Epidural Patient position: sitting Prep: site prepped and draped and DuraPrep Patient monitoring: continuous pulse ox and blood pressure Approach: midline Location: L3-L4 Injection technique: LOR air and LOR saline  Needle:  Needle type: Tuohy  Needle gauge: 17 G Needle length: 9 cm and 9 Needle insertion depth: 5 cm cm Catheter type: closed end flexible Catheter size: 19 Gauge Catheter at skin depth: 10 cm Test dose: negative  Assessment Events: blood not aspirated, injection not painful, no injection resistance, negative IV test and no paresthesia

## 2016-03-08 NOTE — Discharge Summary (Signed)
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 yo G3P3003 at 313w0das admitted in Active Labor on 03/08/2016. Patient had an uncomplicated labor course as follows:  Membrane Rupture Time/Date: 4:18 PM ,03/08/2016   Intrapartum Procedures: Episiotomy: None [1]                                         Lacerations:  2nd degree [3];Perineal [11]  Patient had a delivery of a Viable infant. 03/08/2016  Information for the patient's newborn:  HaVernadette, [place][[id number]]Delivery Method: Vaginal, Spontaneous Delivery (Filed from Delivery Summary)    Pateint had an uncomplicated postpartum course.  She is ambulating, tolerating a regular diet, passing flatus, and urinating well. Patient is discharged home in stable condition on 03/09/16.    Physical exam  Vitals:   03/08/16 1815 03/08/16 1850 03/08/16 2000 03/09/16 0710  BP:  (!) 104/56 (!) 95/48 (!) 92/55  Pulse:  66 85 93  Resp: _0 Temp: 98.7 F (37.1 C) 97.8 F (36.6 C) 98.2 F (36.8 C) 98.4 F (36.9 C)  TempSrc: Axillary Oral Oral Oral  SpO2:   99% 96%  Weight:      Height:       General: alert, cooperative and no distress Lochia: appropriate Uterine Fundus: firm Incision: Healing well with no significant  drainage DVT Evaluation: No evidence of DVT seen on physical exam. Negative Homan's sign. No cords or calf tenderness. No significant calf/ankle edema. Labs: Lab Results  Component Value Date   WBC 13.6 (H) 03/09/2016   HGB 9.3 (L) 03/09/2016   HCT 27.6 (L) 03/09/2016   MCV 67.6 (L) 03/09/2016   PLT 132 (L) 03/09/2016    Discharge instruction: per After Visit Summary and "Baby and Me Booklet".  After visit meds:    Medication List    TAKE these medications   prenatal multivitamin Tabs tablet Take 1 tablet by mouth daily.       Diet: routine diet  Activity: Advance as tolerated. Pelvic rest for 6 weeks.   Outpatient follow up:6 weeks   Future Appointments Date Time Provider [place]8/14/2017 12:00 AM WH-BSSCHED ROOM WH-BSSCHED None    Postpartum contraception: To be discussed w/ pt's OB  Newborn Data: Live born female "Reagan" Birth Weight: 6 lb 8.2 oz (2955 g) APGARS: 8, 9  Baby Feeding: Breast Disposition:home with mother   03/09/2016 , , CNM   

## 2016-03-09 LAB — CBC
HCT: 27.6 % — ABNORMAL LOW (ref 36.0–46.0)
Hemoglobin: 9.3 g/dL — ABNORMAL LOW (ref 12.0–15.0)
MCH: 22.8 pg — AB (ref 26.0–34.0)
MCHC: 33.7 g/dL (ref 30.0–36.0)
MCV: 67.6 fL — ABNORMAL LOW (ref 78.0–100.0)
PLATELETS: 132 10*3/uL — AB (ref 150–400)
RBC: 4.08 MIL/uL (ref 3.87–5.11)
RDW: 14.9 % (ref 11.5–15.5)
WBC: 13.6 10*3/uL — AB (ref 4.0–10.5)

## 2016-03-09 LAB — RPR: RPR: NONREACTIVE

## 2016-03-09 LAB — HIV ANTIBODY (ROUTINE TESTING W REFLEX): HIV Screen 4th Generation wRfx: NONREACTIVE

## 2016-03-09 NOTE — Lactation Note (Signed)
This note was copied from a baby's chart. Lactation Consultation Note  P3.  Mother states 1st child has reflux so she did not breastfeed, 2nd child she pumped for a few months. [employer] employee.  Attended breastfeeding classes. Trying to latch baby upon entering.  Helped her bring baby to her. Helped with cross cradle hold. Sucks and swallows observed. Mom encouraged to feed baby 8-12 times/24 hours and with feeding cues.  Mom made aware of O/P services, breastfeeding support groups, community resources, and our phone # for post-discharge questions.    Patient Name: Boy Alpha GulaRegina Orlick UJWJX'BToday's Date: 03/09/2016 Reason for consult: Initial assessment   Maternal Data Has patient been taught Hand Expression?: Yes Does the patient have breastfeeding experience prior to this delivery?: Yes  Feeding Feeding Type: Breast Fed  LATCH Score/Interventions Latch: Repeated attempts needed to sustain latch, nipple held in mouth throughout feeding, stimulation needed to elicit sucking reflex. Intervention(s): Adjust position;Assist with latch;Breast massage  Audible Swallowing: A few with stimulation Intervention(s): Skin to skin  Type of Nipple: Everted at rest and after stimulation  Comfort (Breast/Nipple): Soft / non-tender     Hold (Positioning): Assistance needed to correctly position infant at breast and maintain latch.  LATCH Score: 7  Lactation Tools Discussed/Used     Consult Status Consult Status: Follow-up Date: 03/10/16 Follow-up type: In-patient    Dahlia ByesBerkelhammer, [facility] 03/09/2016, 10:53 AM

## 2016-03-09 NOTE — Addendum Note (Signed)
Addendum  created 03/09/16 16100723 by Yolonda KidaAlison L Sona Nations, CRNA   Charge Capture section accepted, Sign clinical note

## 2016-03-09 NOTE — Anesthesia Postprocedure Evaluation (Signed)
Anesthesia Post Note  Patient: Lorraine West  Procedure(s) Performed: * No procedures listed *  Patient location during evaluation: Mother Baby Anesthesia Type: Epidural Level of consciousness: awake, awake and alert, oriented and patient cooperative Pain management: pain level controlled Vital Signs Assessment: post-procedure vital signs reviewed and stable Respiratory status: spontaneous breathing, nonlabored ventilation and respiratory function stable Cardiovascular status: stable Postop Assessment: patient able to bend at knees, no headache, no backache and no signs of nausea or vomiting Anesthetic complications: no     Last Vitals:  Vitals:   03/08/16 1850 03/08/16 2000  BP: (!) 104/56 (!) 95/48  Pulse: 66 85  Resp: 18 18  Temp: 36.6 C 36.8 C    Last Pain:  Vitals:   03/08/16 2000  TempSrc: Oral  PainSc: 0-No pain   Pain Goal:                 [name] L

## 2016-03-10 ENCOUNTER — Inpatient Hospital Stay (HOSPITAL_COMMUNITY): Admission: RE | Admit: 2016-03-10 | Payer: 59 | Source: Ambulatory Visit

## 2016-03-12 DIAGNOSIS — J352 Hypertrophy of adenoids: Secondary | ICD-10-CM | POA: Diagnosis not present

## 2016-04-21 DIAGNOSIS — Z1502 Genetic susceptibility to malignant neoplasm of ovary: Secondary | ICD-10-CM | POA: Diagnosis not present

## 2016-04-21 DIAGNOSIS — Z1501 Genetic susceptibility to malignant neoplasm of breast: Secondary | ICD-10-CM | POA: Diagnosis not present

## 2016-04-21 DIAGNOSIS — Z23 Encounter for immunization: Secondary | ICD-10-CM | POA: Diagnosis not present

## 2016-04-22 DIAGNOSIS — H5213 Myopia, bilateral: Secondary | ICD-10-CM | POA: Diagnosis not present

## 2016-04-24 ENCOUNTER — Encounter: Payer: Self-pay | Admitting: Genetic Counselor

## 2016-04-29 ENCOUNTER — Telehealth: Payer: Self-pay | Admitting: Genetic Counselor

## 2016-04-29 NOTE — Telephone Encounter (Signed)
Pt cld to reschedule appt. Appt scheduled to 11/21 at 2pm w/Kayla Boggs

## 2016-05-15 DIAGNOSIS — Z30430 Encounter for insertion of intrauterine contraceptive device: Secondary | ICD-10-CM | POA: Diagnosis not present

## 2016-06-03 ENCOUNTER — Other Ambulatory Visit: Payer: 59

## 2016-06-03 ENCOUNTER — Encounter: Payer: 59 | Admitting: Genetic Counselor

## 2016-06-13 ENCOUNTER — Ambulatory Visit: Payer: 59 | Admitting: Nurse Practitioner

## 2016-06-16 ENCOUNTER — Encounter: Payer: 59 | Admitting: Genetic Counselor

## 2016-06-17 ENCOUNTER — Other Ambulatory Visit: Payer: 59

## 2016-06-17 ENCOUNTER — Ambulatory Visit (HOSPITAL_BASED_OUTPATIENT_CLINIC_OR_DEPARTMENT_OTHER): Payer: 59 | Admitting: Genetic Counselor

## 2016-06-17 DIAGNOSIS — Z8489 Family history of other specified conditions: Secondary | ICD-10-CM

## 2016-06-17 DIAGNOSIS — Z801 Family history of malignant neoplasm of trachea, bronchus and lung: Secondary | ICD-10-CM

## 2016-06-17 DIAGNOSIS — Z800 Family history of malignant neoplasm of digestive organs: Secondary | ICD-10-CM

## 2016-06-17 DIAGNOSIS — Z8042 Family history of malignant neoplasm of prostate: Secondary | ICD-10-CM

## 2016-06-17 DIAGNOSIS — Z8049 Family history of malignant neoplasm of other genital organs: Secondary | ICD-10-CM | POA: Diagnosis not present

## 2016-06-17 DIAGNOSIS — Z803 Family history of malignant neoplasm of breast: Secondary | ICD-10-CM | POA: Diagnosis not present

## 2016-06-17 DIAGNOSIS — Z809 Family history of malignant neoplasm, unspecified: Secondary | ICD-10-CM

## 2016-06-17 DIAGNOSIS — Z315 Encounter for genetic counseling: Secondary | ICD-10-CM

## 2016-06-17 DIAGNOSIS — Z8051 Family history of malignant neoplasm of kidney: Secondary | ICD-10-CM

## 2016-06-18 ENCOUNTER — Encounter: Payer: Self-pay | Admitting: Genetic Counselor

## 2016-06-18 DIAGNOSIS — Z803 Family history of malignant neoplasm of breast: Secondary | ICD-10-CM | POA: Insufficient documentation

## 2016-06-18 NOTE — Progress Notes (Signed)
REFERRING PROVIDER: Thurnell Lose, MD [address] [place], [place] [address]  PRIMARY PROVIDER:  No primary care provider on file.  PRIMARY REASON FOR VISIT:  1. Family history of breast cancer in female   2. Family history of prostate cancer   3. Family history of cervical cancer   4. Family history of colon cancer   5. Family history of brain tumor   6. Family history of renal cancer   7. Family history of lung cancer   8. Family history of cancer      HISTORY OF PRESENT ILLNESS:   Lorraine West, a 35 y.o. female, was seen for a [facility] cancer genetics consultation at the request of Dr. Simona Huh due to a reported personal history of a BRCA gene mutation and family history of early-onset breast cancer, prostate cancer, and other cancers.  Lorraine West presents to clinic today to discuss the possibility of a hereditary predisposition to cancer, genetic testing, and to further clarify her future cancer risks, as well as potential cancer risks for family members.    Lorraine West is a 35 y.o. female with no personal history of cancer.  She reports that she previously had genetic testing approximately 13 years ago in [place], [place]. She does not remember the name of her doctor or of the physician's office at that time.  She reports that she had blood testing and did not even know at the time that her doctor was ordering genetic testing.  Afterward, her doctor told her that she had a BRCA gene mutation, but she is not sure what BRCA gene is affected or what the mutation is called.  She has no access to a test report, and, currently, there seems to be no way to track it down.  Dr. Simona Huh has referred her to [facility] today to discuss testing to verify that she does have a BRCA mutation and to determine what mutation she has.   HORMONAL RISK FACTORS:  Menarche was at age 59-11.  First live birth at age 72.  OCP use for approximately 0 years.  Ovaries intact: yes.   Hysterectomy: no.  Menopausal status: premenopausal.  HRT use: 0 years. Colonoscopy: yes; reports that she had one in 2003 or 2004 due to GI issues; reports no polyps, but maybe had some hemorrhoids. Mammogram within the last year: thinks she may have had a mammogram when she was pregnant. Number of breast biopsies: 0. Up to date with pelvic exams:  yes. Any excessive radiation exposure/other exposures in the past:  no  Past Medical History:  Diagnosis Date  . Alpha thalassemia ([place])   . Heart murmur    MVP    Past Surgical History:  Procedure Laterality Date  . NO PAST SURGERIES      Social History   Social History  . Marital status: Married    Spouse name: N/A  . Number of children: N/A  . Years of education: N/A   Social History Main Topics  . Smoking status: Never Smoker  . Smokeless tobacco: Never Used  . Alcohol use No  . Drug use: No  . Sexual activity: Not on file   Other Topics Concern  . Not on file   Social History Narrative  . No narrative on file     FAMILY HISTORY:  We obtained a detailed, 4-generation family history.  Significant diagnoses are listed below: Family History  Problem Relation Age of Onset  . Breast cancer Mother   .  Diabetes Father   . Breast cancer Father 43    d. 52y  . Colon cancer Maternal Grandmother     dx [phone]  . Heart disease Maternal Grandfather   . Heart Problems Maternal Grandfather   . Cancer Maternal Grandfather     NOS cancer  . Kidney cancer Paternal Grandmother     dx unspecified age  . Lung cancer Paternal Grandfather     dx unspecified age  . Cervical cancer Maternal Aunt     d. 62y  . Brain cancer Cousin 83    maternal 1st cousin d. 33y; NOS brain tumor  . Breast cancer Cousin 46    maternal 1st cousin  . Prostate cancer Cousin 25    maternal 1st cousin d. 25y    Lorraine West has three sons, ages 68 months to 48 years.  She has two full sisters and one full brother, ages 26-39.  None of her  siblings have ever had cancer.  Lorraine West mother was diagnosed with breast cancer at 45.  She passed away from her cancer at 97.  Lorraine West father is currently 48 and has not had cancer.  Lorraine West mother had three full sisters and three full brothers.  One sister died of a cervical cancer at 10.  The other two sisters are in their late 60s-70s and have not had cancer.  One brother died in a car accident at 45.  The other two brothers are in their late 52s-early 70s and have not had cancer.  Lorraine West reports that two of her maternal first cousins have had a tumor/cancer.  One cousin died of a brain tumor (unspecified type) at age 59.  Another cousin (a sister of the cousin with the brain tumor) was diagnosed with breast cancer at 91.  Lorraine West maternal grandmother was diagnosed with colon cancer at age 76-83 and she passed away at 42.  Lorraine West maternal grandfather died of heart problems at 100.  He had a history of an unspecified type of cancer.  Lorraine West has limited information for any of her maternal great aunts/uncles and great grandparents.  Lorraine West father has one full sister who is currently 37 and has never had cancer.  She had three sons, two of whom have passed away.  One of these sons was diagnosed with and passed away from prostate cancer at age 21.  Lorraine West father also has two paternal half-brothers, both of whom are currently in their mid-50s to late 23s and have never had cancer.  Lorraine West reports no other known cancer for any other paternal first cousins.  Her paternal grandparents have passed away.  She has limited information for these grandparents, but does report that her grandmother had kidney cancer and her grandfather had lung cancer.  She has no information for her paternal great aunts/uncles and great grandparents.  Lorraine West reports no known family history of genetic testing for hereditary cancer risks.  Patient's maternal  ancestors are from [place], [place], and the [place] and paternal ancestors are of [place] American, Native American, and Caucasian descent. There is no reported Ashkenazi Jewish ancestry. There is no known consanguinity.  GENETIC COUNSELING ASSESSMENT: Lorraine West is a 35 y.o. female with a reported personal history of a BRCA gene mutation and family history of early-onset breast cancer which is somewhat suggestive of hereditary breast and ovarian cancer syndrome and predisposition to cancer. We, therefore, discussed and recommended the following at today's visit.  DISCUSSION: We reviewed the characteristics, features and inheritance patterns of hereditary cancer syndromes, particularly those caused by mutations within the BRCA1/2 genes. We also discussed genetic testing, including the appropriate family members to test, the process of testing, insurance coverage and turn-around-time for results. We discussed the implications of a negative, positive and/or variant of uncertain significant result. We recommended Lorraine West pursue genetic testing for the 42-gene [facility] Common Hereditary Cancers Panel (Breast, Gyn, GI) through [facility].  The 42-gene [facility] Common Hereditary Cancers Panel (Breast, Gyn, GI) performed by [facility], [place]) includes sequencing and/or deletion/duplication analysis for the following genes: APC, ATM, AXIN2, BARD1, BMPR1A, BRCA1, BRCA2, BRIP1, CDH1, CDKN2A, CHEK2, DICER1, EPCAM, GREM1, KIT, MEN1, MLH1, MSH2, MSH6, MUTYH, NBN, NF1, PALB2, PDGFRA, PMS2, POLD1, POLE, PTEN, RAD50, RAD51C, RAD51D, SDHA, SDHB, SDHC, SDHD, SMAD4, SMARCA4, STK11, TP53, TSC1, TSC2, and VHL.   Based on Lorraine West's family history of cancer, she meets medical criteria for genetic testing. Despite that she meets criteria, she may still have an out of pocket cost. We discussed that will will have the laboratory perform a pre-benefits investigation to determine how much her  out-of-pocket cost for genetic testing is estimated to be.  If this cost is expensive, we discussed the $250 self-pay genetic testing option.  We will follow up with her after the benefits investigation and we will choose how to proceed from there.  PLAN: After considering the risks, benefits, and limitations, Ms. Ferrall  provided informed consent to pursue genetic testing and the blood sample was sent to [facility] for analysis of the 42-gene Inv[facility]ae Common Hereditary Cancers Panel (Breast, Gyn, GI). Results should be available within approximately 2-3 weeks' time, at which point they will be disclosed by telephone to Ms. Hawker, as will any additional recommendations warranted by these results. Ms. Little will receive a summary of her genetic counseling visit and a copy of her results once available. This information will also be available in Epic. We encouraged Ms. Ojo to remain in contact with cancer genetics annually so that we can continuously update the family history and inform her of any changes in cancer genetics and testing that may be of benefit for her family. Ms. Kastens questions were answered to her satisfaction today. Our contact information was provided should additional questions or concerns arise.  Thank you for the referral and allowing [place] to share in the care of your patient.   Jeanine Luz, MS, [facility] Certified Genetic Counselor [email or web address] Phone: [phone]  The patient was seen for a total of 60 minutes in face-to-face genetic counseling.  This patient was discussed with Drs. Magrinat, Lindi Adie and/or Burr Medico who agrees with the above.    _______________________________________________________________________ For Office Staff:  Number of people involved in session: 1 Was an Intern/ student involved with case: no

## 2016-07-02 ENCOUNTER — Telehealth: Payer: Self-pay | Admitting: Genetic Counselor

## 2016-07-02 NOTE — Telephone Encounter (Signed)
Discussed with Lorraine West that her genetic test result was negative for known pathogenic mutations within any of 42 gene on the Inv[facility]ae Common Hereditary Cancers Panel (Breast, Gyn, GI) through [facility].  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  Discussed that we treat this like a negative test result until it gets updated by the lab and reviewed why we do that.  Encouraged Lorraine West to keep her phone number up-to-date with [place], so that we can call her if this gets updated.  Of note, we did NOT find a mutation in either of the BRCA genes, which was our main concern due to Lorraine West's previous doctor in [place] telling her she had a BRCA mutation.  With the limited information we have, there seems to be no way for [place] to track down a previous report from that office, if there is one.  I will call [facility] to see if they can tell me if anyone under Lorraine West's name/DOB has had genetic testing through them, as a last resort.  Overall, I am confident that if there had actually been a BRCA mutation found approx 13 years ago, that that would have been picked up on this testing.  Lorraine West is also reassured by this.  I will follow-up with her via phone once I speak with [facility].  I will email her a copy of her test result.  She knows she is welcome to call with any questions.

## 2016-07-04 ENCOUNTER — Telehealth: Payer: Self-pay | Admitting: Genetic Counselor

## 2016-07-04 NOTE — Telephone Encounter (Signed)
Followed up with [facility] to see if Lorraine West previously had genetic testing there.  They checked and they don't have a record of her.  Unlikely that she had genetic testing there.  Most likely she did not have genetic testing previously.  Let Lorraine West know this.

## 2016-07-07 ENCOUNTER — Ambulatory Visit: Payer: Self-pay | Admitting: Genetic Counselor

## 2016-07-07 DIAGNOSIS — Z803 Family history of malignant neoplasm of breast: Secondary | ICD-10-CM

## 2016-07-07 DIAGNOSIS — Z1379 Encounter for other screening for genetic and chromosomal anomalies: Secondary | ICD-10-CM

## 2016-07-07 DIAGNOSIS — Z809 Family history of malignant neoplasm, unspecified: Secondary | ICD-10-CM

## 2016-07-14 DIAGNOSIS — Z1379 Encounter for other screening for genetic and chromosomal anomalies: Secondary | ICD-10-CM | POA: Insufficient documentation

## 2016-07-14 NOTE — Progress Notes (Signed)
GENETIC TEST RESULT  HPI: Ms. Tirone was previously seen in the [facility] clinic due to a family history of early-onset breast cancer and other cancers and because she was told several years ago by a previous doctor that she had a BRCA gene mutation. Please refer to our prior cancer genetics clinic note from June 17, 2016 for more information regarding Ms. Cronic's medical, social and family histories, and our assessment and recommendations, at the time. Ms. Glymph recent genetic test results were disclosed to her, as were recommendations warranted by these results. These results and recommendations are discussed in more detail below.  GENETIC TEST RESULTS: Genetic testing reported out on July 01, 2016 through the 42-gene Invitae Common Hereditary Cancers Panel (Breast, Gyn, GI) found no known deleterious mutations.  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 The 42-gene Inv[facility]ae Common Hereditary Cancers Panel (Breast, Gyn, GI) performed by [facility] [place], [place]) includes sequencing and/or deletion/duplication analysis for the following genes: APC, ATM, AXIN2, BARD1, BMPR1A, BRCA1, BRCA2, BRIP1, CDH1, CDKN2A, CHEK2, DICER1, EPCAM, GREM1, KIT, MEN1, MLH1, MSH2, MSH6, MUTYH, NBN, NF1, PALB2, PDGFRA, PMS2, POLD1, POLE, PTEN, RAD50, RAD51C, RAD51D, SDHA, SDHB, SDHC, SDHD, SMAD4, SMARCA4, STK11, TP53, TSC1, TSC2, and VHL.  The test report will be scanned into EPIC and will be located under the Molecular Pathology section of the Results Review tab.   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 At this time, it is unknown if this VUS is associated with an increased risk for cancer or if this is a normal finding. Since this VUS result is uncertain, it cannot help guide screening recommendations, and  family members should not be tested for this VUS to help define their own cancer risks.  Also, we all have variants within our genes that make [place] unique individuals--most of these variants are benign.  Thus, we treat this VUS as a negative result until it gets updated by the lab.   With time, we suspect the lab will reclassify this variant and when they do, we will try to re-contact Ms. Dildine to discuss the reclassification further.  We also encouraged Ms. Strojny to contact us in a year or two to obtain an update on the status of this VUS.  We discussed with Ms. Casstevens that since the current genetic testing is not perfect, it is possible there may be a gene mutation in one of these genes that current testing cannot detect, but that chance is small. We also discussed, that it is possible that another gene that has not yet been discovered, or that we have not yet tested, is responsible for the cancer diagnoses in the family, and it is, therefore, important to remain in touch with cancer genetics in the future so that we can continue to offer Ms. Alcorn the most up-to-date genetic testing.   We have no way of obtaining a copy of any of Ms. Moone's medical records from her previous 27 office in [place], [place], where her previous doctor had told her she had a BRCA gene mutation.  She has no further information for the doctor she saw or for the doctor's office she was previously seen at at the time.  She is fairly certain that she likely did not have any genetic testing at the time, and she is not sure why her doctor told her that  she had a BRCA gene mutation.  I have also called [facility] to determine whether they had a record of her having had genetic testing at the time (since they would have been the only commercial lab providing BRCA testing at the time).  They do not have a record of Ms. Weisse ever having had genetic testing through their lab.  This further  reassures [place] that it is unlikely that she had genetic testing before.  Even if she did, today's genetic testing almost certainly would have been able to pick up on a BRCA gene mutation if there was one found on older testing methods.    CANCER SCREENING RECOMMENDATIONS: While we still do not have an explanation for the family history of cancer, this normal result may be reassuring and indicate that Ms. Voit does not likely have an increased risk of cancer due to a mutation in one of these genes. We, therefore, recommended  Ms. Ivan continue to follow the cancer screening guidelines provided by her primary healthcare providers. Ms. Klinck can be getting annual mammograms now, due to her mother's history of breast cancer at age 48.    RECOMMENDATIONS FOR FAMILY MEMBERS: Women in this family might be at some increased risk of developing cancer, over the general population risk, simply due to the family history of cancer. We recommended women in this family have a yearly mammogram beginning at age 7, or 74 years younger than the earliest onset of cancer, an annual clinical breast exam, and perform monthly breast self-exams. Ms. Syring sisters can all be getting mammograms now as well.  Women in this family should also have a gynecological exam as recommended by their primary provider. All family members should have a colonoscopy by age 75.  Based on Ms. Lauritsen's family history, we recommended her maternal first cousin, who was diagnosed with breast cancer at age 79, have genetic counseling and testing. Ms. Neace will let us know if we can be of any assistance in coordinating genetic counseling and/or testing for this family member.   FOLLOW-UP: Lastly, we discussed with Ms. Sangster that cancer genetics is a rapidly advancing field and it is possible that new genetic tests will be appropriate for her and/or her family members in the future. We encouraged her to remain in contact with cancer  genetics on an annual basis so we can update her personal and family histories and let her know of advances in cancer genetics that may benefit this family.   Our contact number was provided. Ms. Gosa questions were answered to her satisfaction, and she knows she is welcome to call us at anytime with additional questions or concerns.   Jeanine Luz, MS, [facility] Certified Genetic Counselor [email or web address] Phone: [phone]

## 2016-07-15 ENCOUNTER — Ambulatory Visit: Payer: 59 | Admitting: Nurse Practitioner

## 2016-08-07 ENCOUNTER — Ambulatory Visit: Payer: 59 | Admitting: Family

## 2016-09-29 DIAGNOSIS — R002 Palpitations: Secondary | ICD-10-CM | POA: Diagnosis not present

## 2016-09-29 DIAGNOSIS — Z0000 Encounter for general adult medical examination without abnormal findings: Secondary | ICD-10-CM | POA: Diagnosis not present

## 2016-09-29 DIAGNOSIS — D560 Alpha thalassemia: Secondary | ICD-10-CM | POA: Diagnosis not present

## 2016-10-01 ENCOUNTER — Telehealth: Payer: Self-pay | Admitting: Cardiology

## 2016-10-01 NOTE — Telephone Encounter (Signed)
[facility] with Dr Antoine PocheHochrein.  Records put with Dr Hochrein's schedule for 10/08/16. lp

## 2016-10-06 ENCOUNTER — Encounter: Payer: Self-pay | Admitting: Cardiology

## 2016-10-07 NOTE — Progress Notes (Signed)
Cardiology Office Note   Date:  10/08/2016   ID:  Lorraine West, DOB 10-Jun-1981, MRN [id number]  PCP:  Georgianne Fick, MD  Cardiologist:   Rollene Rotunda, MD  Referring:  Georgianne Fick, MD  Chief Complaint  Patient presents with  . Palpitations     History of Present Illness: Lorraine West is a 36 y.o. female who presents for evaluation of palpitations. She's had a history of mitral valve prolapse diagnosed she was implanted. He was pregnant at that time.  She hasn't seen in several years. He does have palpitations related she has these spells she'll have a couple of days in a row that she has frequent skipped beats. She describes isolated skipped beats it might be frequent in an hour.  She does not describe sustained tachypalpitations. She doesn't have any syncope or syncope. She can't bring these on. They happen at rest. She denies any chest pressure, neck or arm discomfort. She's had no shortness of breath, PND or orthopnea. She's had no weight gain or edema. She has 3 children time. She does drink a little caffeine.    Past Medical History:  Diagnosis Date  . Alpha thalassemia (HCC)   . Asthma   . Heart murmur    MVP    Past Surgical History:  Procedure Laterality Date  . NO PAST SURGERIES       Current Outpatient Prescriptions  Medication Sig Dispense Refill  . Prenatal Vit-Fe Fumarate-FA (PRENATAL MULTIVITAMIN) TABS tablet Take 1 tablet by mouth daily.    . propranolol (INDERAL) 10 MG tablet Take 1 tablet (10 mg total) by mouth 3 (three) times daily as needed. 90 tablet 6   No current facility-administered medications for this visit.     Allergies:   Gluten meal    Social History:  The patient  reports that she has never smoked. She has never used smokeless tobacco. She reports that she does not drink alcohol or use drugs.   Family History:  The patient's family history includes Brain cancer (age of onset: 82) in her cousin; Breast cancer (age of  onset: 21) in her cousin; Breast cancer (age of onset: 55) in her mother; Cancer in her maternal grandfather; Cervical cancer in her maternal aunt; Colon cancer in her maternal grandmother; Diabetes in her father; Heart Problems in her maternal grandfather; Heart disease in her maternal grandfather; Kidney cancer in her paternal grandmother; Lung cancer in her paternal grandfather; Prostate cancer (age of onset: 25) in her cousin.    ROS:  Please see the history of present illness.   Otherwise, review of systems are positive for none.   All other systems are reviewed and negative.    PHYSICAL EXAM: VS:  BP 106/74   Pulse 73   Ht 4\' 11"  (1.499 m)   Wt 114 lb (51.7 kg)   BMI 23.03 kg/m  , BMI Body mass index is 23.03 kg/m. GENERAL:  Well appearing HEENT:  Pupils equal round and reactive, fundi not visualized, oral mucosa unremarkable NECK:  No jugular venous distention, waveform within normal limits, carotid upstroke brisk and symmetric, no bruits, no thyromegaly LYMPHATICS:  No cervical, inguinal adenopathy LUNGS:  Clear to auscultation bilaterally BACK:  No CVA tenderness CHEST:  Unremarkable HEART:  PMI not displaced or sustained,S1 and S2 within normal limits, no S3, no S4, no clicks, no rubs, no murmurs ABD:  Flat, positive bowel sounds normal in frequency in pitch, no bruits, no rebound, no guarding, no midline pulsatile mass, no hepatomegaly,  no splenomegaly EXT:  2 plus pulses throughout, no edema, no cyanosis no clubbing SKIN:  No rashes no nodules NEURO:  Cranial nerves II through XII grossly intact, motor grossly intact throughout PSYCH:  Cognitively intact, oriented to person place and time    EKG:  EKG is ordered today. The ekg ordered today demonstrates sinus rhythm, RSR prime V1 and V2, possible left atrial enlargement, poor anterior R wave progression, no acute ST-T wave changes.   Recent Labs: 03/09/2016: Hemoglobin 9.3; Platelets 132    Lipid Panel No results  found for: CHOL, TRIG, HDL, CHOLHDL, VLDL, LDLCALC, LDLDIRECT    Wt Readings from Last 3 Encounters:  10/08/16 114 lb (51.7 kg)  03/08/16 134 lb (60.8 kg)      Other studies Reviewed: Additional studies/ records that were reviewed today include: Office records, labs. Review of the above records demonstrates:  Please see elsewhere in the note.     ASSESSMENT AND PLAN:  QUESTIONABLE MVP:  I don't appreciate a click. She does have a very obvious etiologic split second heart sound. She has slight murmur that is mostly murmur. However, to further evaluate she will get an echocardiogram. I don't suspect that she has significant regurgitation with prolapse. This will probably be followed clinically. We discussed this at length.  PALPITATIONS:  She does have premature atrial contractions or symptomatic.  I wouldn't give her propranolol 10 mg when necessary as a pill in pocket approach. She does have very mildly decreased TSH and is having follow-up labs per [facility], MD   Current medicines are reviewed at length with the patient today.  The patient does not have concerns regarding medicines.  The following changes have been made:  no change  Labs/ tests ordered today include:   Orders Placed This Encounter  Procedures  . EKG 12-Lead  . ECHOCARDIOGRAM COMPLETE     Disposition:   FU with me as needed.     Signed, Rollene RotundaJames Trejon Duford, MD  10/08/2016 1:27 PM    [facility]

## 2016-10-08 ENCOUNTER — Ambulatory Visit (INDEPENDENT_AMBULATORY_CARE_PROVIDER_SITE_OTHER): Payer: 59 | Admitting: Cardiology

## 2016-10-08 ENCOUNTER — Encounter: Payer: Self-pay | Admitting: Cardiology

## 2016-10-08 VITALS — BP 106/74 | HR 73 | Ht 59.0 in | Wt 114.0 lb

## 2016-10-08 DIAGNOSIS — R002 Palpitations: Secondary | ICD-10-CM | POA: Diagnosis not present

## 2016-10-08 DIAGNOSIS — I341 Nonrheumatic mitral (valve) prolapse: Secondary | ICD-10-CM

## 2016-10-08 MED ORDER — PROPRANOLOL HCL 10 MG PO TABS: 10.0000 mg | ORAL_TABLET | Freq: Three times a day (TID) | ORAL | 6 refills | Status: DC | PRN

## 2016-10-08 MED FILL — PROPRANOLOL 10 MG TABLET: 10 | 30 days supply | Qty: 90 | Fill #0

## 2016-10-08 NOTE — Patient Instructions (Signed)
Medication Instructions:  START- Propranolol 10 mg three times a day as needed  Labwork: None Ordered  Testing/Procedures: Your physician has requested that you have an echocardiogram. Echocardiography is a painless test that uses sound waves to create images of your heart. It provides your doctor with information about the size and shape of your heart and how well your heart's chambers and valves are working. This procedure takes approximately one hour. There are no restrictions for this procedure.  Follow-Up: Your physician recommends that you schedule a follow-up appointment in: As Needed   Any Other Special Instructions Will Be Listed Below (If Applicable).   If you need a refill on your cardiac medications before your next appointment, please call your pharmacy.

## 2016-10-14 ENCOUNTER — Encounter: Payer: Self-pay | Admitting: Dietician

## 2016-10-14 ENCOUNTER — Encounter: Payer: 59 | Attending: Internal Medicine | Admitting: Dietician

## 2016-10-14 DIAGNOSIS — Z713 Dietary counseling and surveillance: Secondary | ICD-10-CM | POA: Insufficient documentation

## 2016-10-14 DIAGNOSIS — E639 Nutritional deficiency, unspecified: Secondary | ICD-10-CM

## 2016-10-14 NOTE — Progress Notes (Signed)
  Medical Nutrition Therapy:  Appt start time: 1515 end time:  1600.   Assessment:  Primary concerns today: Patient is here alone.  She would like the badge for [facility] [employer] [employer] but also learn about healthy eating for her family.  Patient lives with her 3 children ages 9210, 2, and 7 months.  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   Preferred Learning Style:   No preference indicated   Learning Readiness:   Ready   MEDICATIONS: MVI   DIETARY INTAKE: "Not a big eater especially with stress."  She is currently working on finding a place to live and caring for her children. Usual eating pattern includes 2 meals and 0 snacks per day.  Avoided foods include gluten (due to sensitivity).  Milk (?lactose intolerant)  24-hr recall:  B ( AM): usually skips OR small cup coffee or tea OR occasional oatmeal (time issues) OR weekends:  Eggs, bacon, grits or pancakes.  Snk ( AM): none  L (12 PM): brings Lean Cuisine with occasional bagel chips Snk ( PM): none D ( PM): chicken, rice (sometimes a vegetable) Snk ( PM): none Beverages: water, regular coffee with sweetened creamer, hot tea  Usual physical activity: walking, has fit board but has not been using.  Estimated energy needs: 1600 calories 75 g protein  Progress Towards Goal(s):  In progress.   Nutritional Diagnosis:  NB-1.1 Food and nutrition-related knowledge deficit As related to balanced nutrition.  As evidenced by patient report and diet hx.    Intervention:  Nutrition counseling/education related to healthy eating for herself and family.  Discussed nutrition recommendations for overall health and recommendations for prevention of cancer.  Discussed benefits of exercise.  Consider incorporating breakfast into your day. Find ways to incorporate more fruits and vegetables. Consider reading one of the [employer]'s books  Teaching Method Utilized:  Visual Auditory Hands on  Handouts given during visit include:  25  exercise games and activities for kids  Phrases that Help and Hinder  Be a healthy role model for children  Kid's kitchen skills  25 healthy snack ideas  32 breakfast ideas for kids  Breakfast ideas for adults  Provided web site for [employer]bechoosemyplate.gov for sample meal plans.  Barriers to learning/adherence to lifestyle change: stress   Demonstrated degree of understanding via:  Teach Back   Monitoring/Evaluation:  Dietary intake, exercise, and body weight prn.

## 2016-10-14 NOTE — Patient Instructions (Signed)
Consider incorporating breakfast into your day. Find ways to incorporate more fruits and vegetables. Consider reading one of the [employer] books

## 2016-10-15 DIAGNOSIS — E039 Hypothyroidism, unspecified: Secondary | ICD-10-CM | POA: Diagnosis not present

## 2016-10-23 ENCOUNTER — Ambulatory Visit (HOSPITAL_COMMUNITY): Payer: 59

## 2016-11-17 ENCOUNTER — Other Ambulatory Visit (HOSPITAL_COMMUNITY): Payer: 59

## 2016-11-26 DIAGNOSIS — D560 Alpha thalassemia: Secondary | ICD-10-CM | POA: Diagnosis not present

## 2016-11-26 DIAGNOSIS — E0590 Thyrotoxicosis, unspecified without thyrotoxic crisis or storm: Secondary | ICD-10-CM | POA: Diagnosis not present

## 2016-11-26 DIAGNOSIS — Z0000 Encounter for general adult medical examination without abnormal findings: Secondary | ICD-10-CM | POA: Diagnosis not present

## 2016-12-25 ENCOUNTER — Telehealth: Payer: Self-pay | Admitting: Cardiology

## 2016-12-25 NOTE — Telephone Encounter (Signed)
12-25-16  Spoke with [name] to reschedule the echo that she cancel.  She stated that she didn't want to do the test because she can't afford it.

## 2017-04-14 DIAGNOSIS — L03011 Cellulitis of right finger: Secondary | ICD-10-CM | POA: Diagnosis not present

## 2017-04-14 MED FILL — CEPHALEXIN 500 MG CAPSULE: 500 | 7 days supply | Qty: 21 | Fill #0

## 2017-04-23 DIAGNOSIS — R102 Pelvic and perineal pain: Secondary | ICD-10-CM | POA: Diagnosis not present

## 2017-04-23 DIAGNOSIS — Z803 Family history of malignant neoplasm of breast: Secondary | ICD-10-CM | POA: Diagnosis not present

## 2017-05-21 DIAGNOSIS — R102 Pelvic and perineal pain: Secondary | ICD-10-CM | POA: Diagnosis not present

## 2017-05-21 DIAGNOSIS — Z30431 Encounter for routine checking of intrauterine contraceptive device: Secondary | ICD-10-CM | POA: Diagnosis not present

## 2017-05-21 DIAGNOSIS — Z803 Family history of malignant neoplasm of breast: Secondary | ICD-10-CM | POA: Diagnosis not present

## 2017-05-21 DIAGNOSIS — Z01419 Encounter for gynecological examination (general) (routine) without abnormal findings: Secondary | ICD-10-CM | POA: Diagnosis not present

## 2017-06-04 DIAGNOSIS — E0590 Thyrotoxicosis, unspecified without thyrotoxic crisis or storm: Secondary | ICD-10-CM | POA: Diagnosis not present

## 2017-06-10 DIAGNOSIS — E0590 Thyrotoxicosis, unspecified without thyrotoxic crisis or storm: Secondary | ICD-10-CM | POA: Diagnosis not present

## 2017-11-23 ENCOUNTER — Ambulatory Visit (INDEPENDENT_AMBULATORY_CARE_PROVIDER_SITE_OTHER): Payer: Self-pay | Admitting: Nurse Practitioner

## 2017-11-23 VITALS — BP 108/70 | HR 107 | Temp 99.4°F | Resp 20 | Wt 113.8 lb

## 2017-11-23 DIAGNOSIS — J209 Acute bronchitis, unspecified: Secondary | ICD-10-CM

## 2017-11-23 MED ORDER — BENZONATATE 100 MG PO CAPS: 100.0000 mg | ORAL_CAPSULE | Freq: Three times a day (TID) | ORAL | 0 refills | Status: AC | PRN

## 2017-11-23 MED ORDER — PREDNISONE 10 MG (21) PO TBPK: ORAL_TABLET | ORAL | 0 refills | Status: AC

## 2017-11-23 MED ORDER — ALBUTEROL SULFATE 108 (90 BASE) MCG/ACT IN AEPB: 2.0000 | INHALATION_SPRAY | Freq: Four times a day (QID) | RESPIRATORY_TRACT | 0 refills | Status: DC | PRN

## 2017-11-23 NOTE — Progress Notes (Signed)
Subjective:     Lorraine West is a 37 y.o. female here for evaluation of a cough. Onset of symptoms was 10 days ago. Symptoms have been unchanged since that time. The cough is productive of yellow sputum and is aggravated by nothing. Associated symptoms include: postnasal drip and sputum production. Patient does not have a history of asthma. Patient does not have a history of environmental allergens. Patient has not traveled recently. Patient does not have a history of smoking.   The following portions of the patient's history were reviewed and updated as appropriate: allergies, current medications and past medical history.  Review of Systems Constitutional: positive for fatigue, negative for anorexia, chills, fevers and malaise Eyes: negative Ears, nose, mouth, throat, and face: positive for scratchy throat, negative for ear drainage, earaches, hoarseness, nasal congestion and sore throat Respiratory: positive for cough, pleurisy/chest pain and sputum, negative for asthma, chronic bronchitis, emphysema, hemoptysis and pneumonia Cardiovascular: negative Gastrointestinal: negative Neurological: negative Allergic/Immunologic: positive for hay fever    Objective:    BP 108/70 (BP Location: Right Arm, Patient Position: Sitting, Cuff Size: Normal)   Pulse (!) 107   Temp 99.4 F (37.4 C) (Oral)   Resp 20   Wt 113 lb 12.8 oz (51.6 kg)   SpO2 98%   BMI 22.98 kg/m  General appearance: alert, cooperative, fatigued and no distress Head: Normocephalic, without obvious abnormality, atraumatic Eyes: conjunctivae/corneas clear. PERRL, EOM's intact. Fundi benign. Ears: normal TM's and external ear canals both ears Nose: Nares normal. Septum midline. Mucosa normal. No drainage or sinus tenderness. Throat: lips, mucosa, and tongue normal; teeth and gums normal Lungs: clear to auscultation bilaterally Heart: regular rate and rhythm, S1, S2 normal, no murmur, click, rub or gallop Abdomen: soft,  non-tender; bowel sounds normal; no masses,  no organomegaly Pulses: 2+ and symmetric Skin: Skin color, texture, turgor normal. No rashes or lesions Lymph nodes: cervical and submandibular nodes normal Neurologic: Grossly normal    Assessment:    Acute Bronchitis    Plan:    Explained lack of efficacy of antibiotics in viral disease. Avoid exposure to tobacco smoke and fumes. B-agonist inhaler. Call if shortness of breath worsens, blood in sputum, change in character of cough, development of fever or chills, inability to maintain nutrition and hydration. Avoid exposure to tobacco smoke and fumes. Sterapred dose pack, Albuterol and Tessalon Perles as directed.    Meds ordered this encounter  Medications  . predniSONE (STERAPRED UNI-PAK 21 TAB) 10 MG (21) TBPK tablet    Sig: Take as directed.    Dispense:  21 tablet    Refill:  0    Order Specific Question:   Supervising Provider    Answer:   Stacie Glaze [[id number]]  . benzonatate (TESSALON PERLES) 100 MG capsule    Sig: Take 1 capsule (100 mg total) by mouth 3 (three) times daily as needed for up to 10 days for cough.    Dispense:  30 capsule    Refill:  0    Order Specific Question:   Supervising Provider    Answer:   Stacie Glaze [[id number]]  . Albuterol Sulfate (PROAIR RESPICLICK) 108 (90 Base) MCG/ACT AEPB    Sig: Inhale 2 puffs into the lungs every 6 (six) hours as needed for up to 10 days.    Dispense:  1 each    Refill:  0    Order Specific Question:   Supervising Provider    Answer:   Stacie Glaze [phone]

## 2017-11-23 NOTE — Patient Instructions (Signed)

## 2017-11-24 MED FILL — BENZONATATE 100 MG CAP: 100 | 10 days supply | Qty: 30 | Fill #0

## 2017-11-24 MED FILL — predniSONE 10 MG (21) TBPK: 10 | 6 days supply | Qty: 21 | Fill #0

## 2017-11-24 MED FILL — VENTOLIN HFA 90 MCG INHALER: 108 (90 BAS | 10 days supply | Qty: 18 | Fill #0

## 2017-11-26 ENCOUNTER — Telehealth: Payer: Self-pay

## 2017-11-26 NOTE — Telephone Encounter (Signed)
Patient states she is doing better.

## 2018-03-04 MED FILL — tiZANidine HCL 2 MG TABS: 2 | 15 days supply | Qty: 30 | Fill #0

## 2018-03-04 MED FILL — CELECOXIB 200 MG CAP: 200 | 30 days supply | Qty: 30 | Fill #0

## 2018-03-18 MED FILL — PANTOPRAZOLE SOD DR 40 MG T: 40 | 30 days supply | Qty: 30 | Fill #0

## 2018-04-06 IMAGING — US US MFM OB COMP +14 WKS
1 series · 14 of 28 positions shown · non-contrast
Comparison: none

[Series 1: us mfm ob comp +14 wks · 14 of 92 slices shown]
[im 4/92]
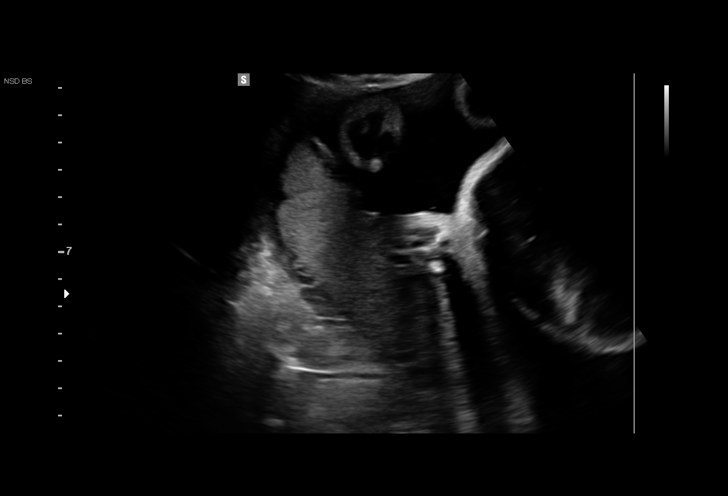
[im 11/92]
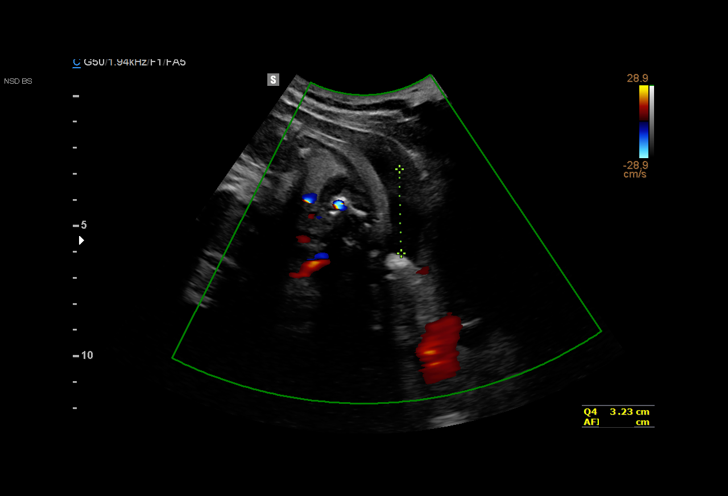
[im 17/92]
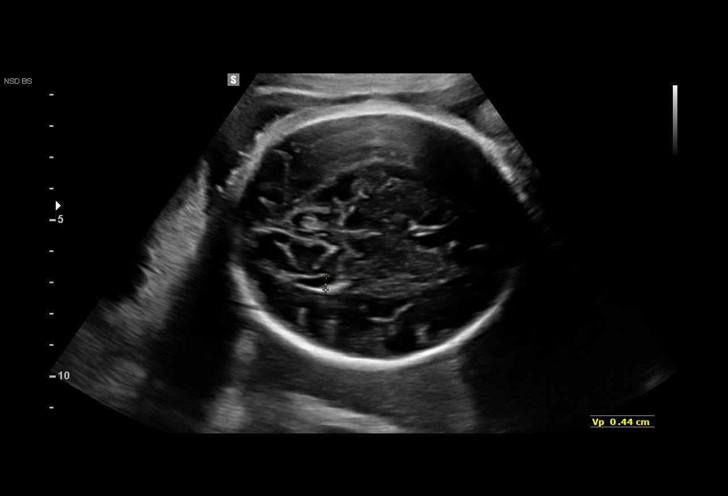
[im 24/92]
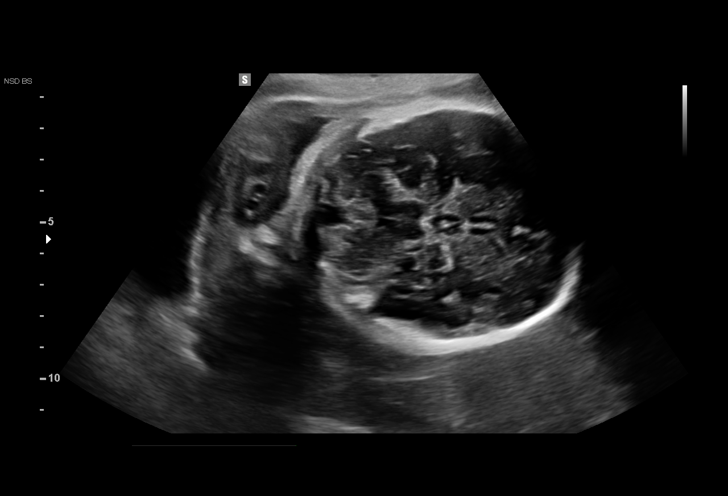
[im 31/92]
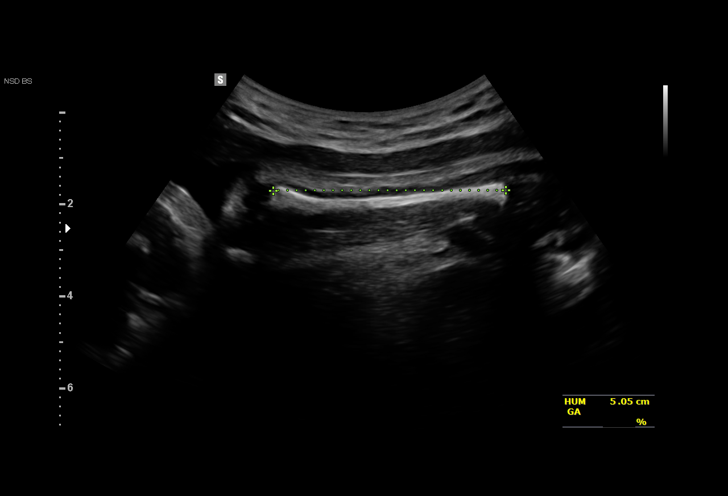
[im 38/92]
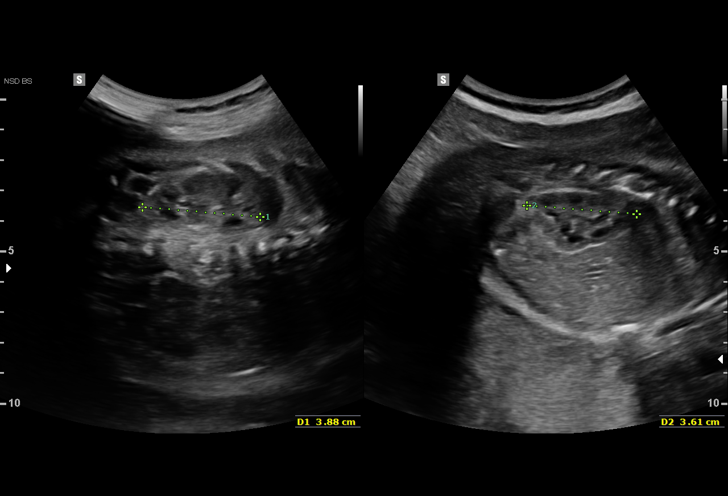
[im 44/92]
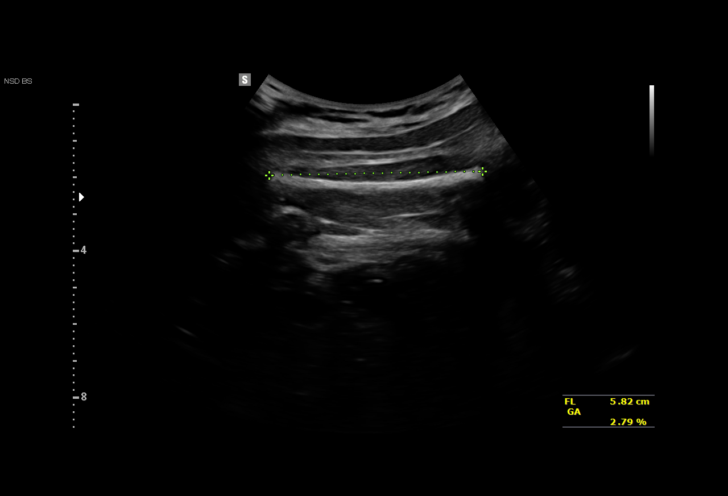
[im 51/92]
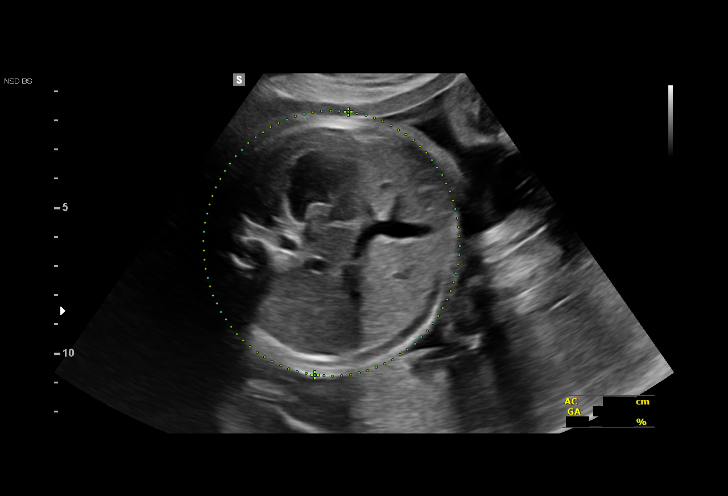
[im 58/92]
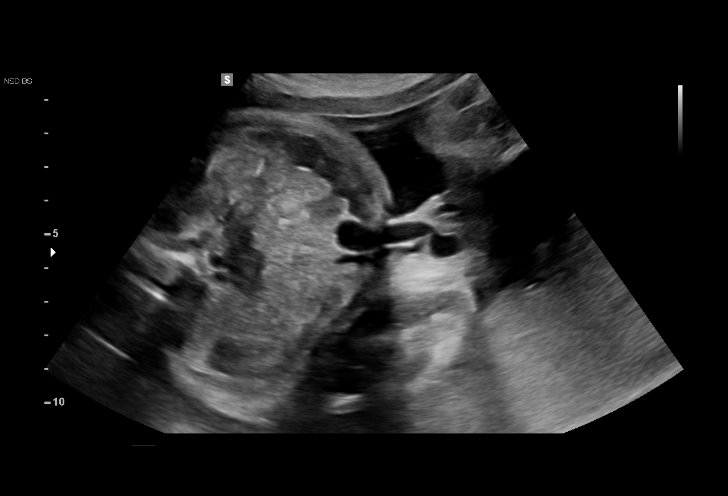
[im 65/92]
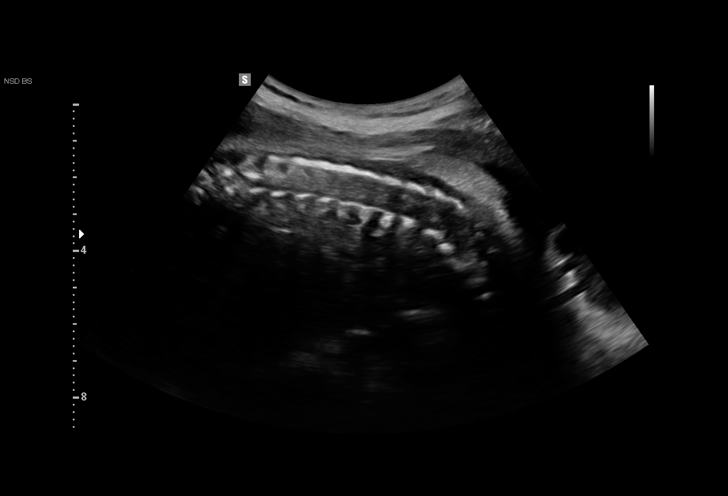
[im 71/92]
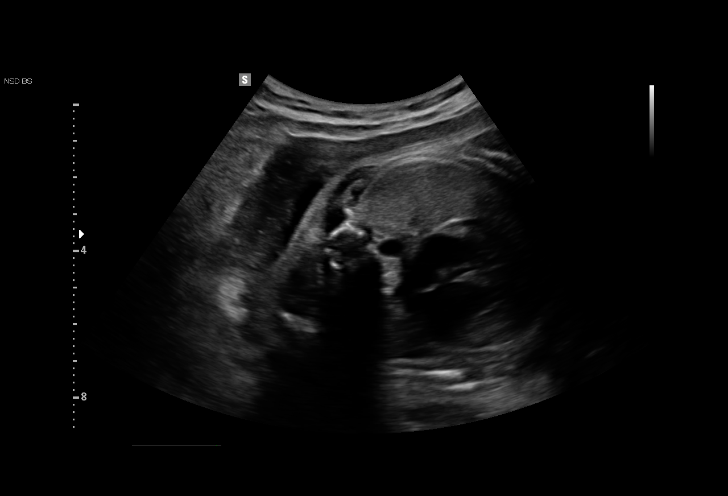
[im 78/92]
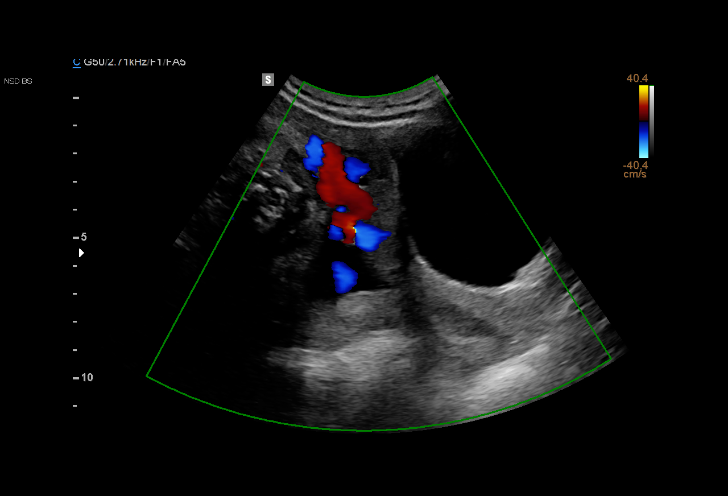
[im 85/92]
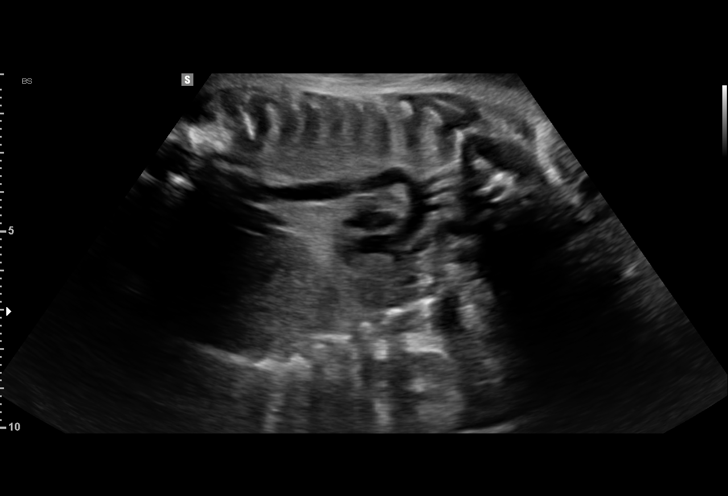
[im 92/92]
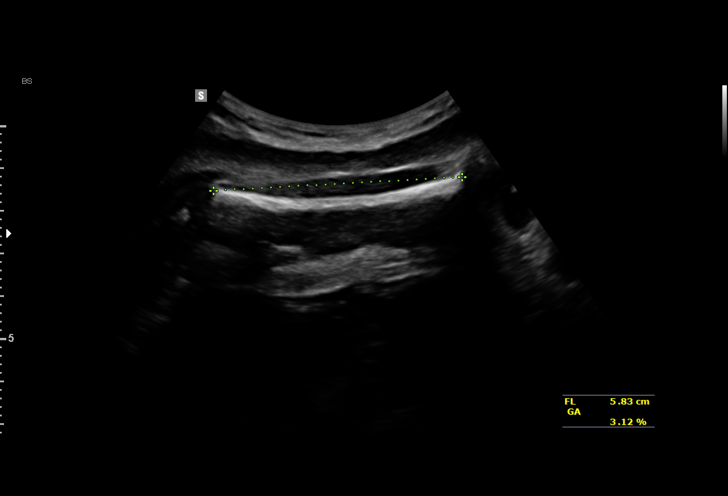

[14 of 28 positions shown; findings below may reference images not displayed]

Ave.,[HOSPITAL]

1  [place] ZANGO           [id number]      [id number]     [id number]
Indications

32 weeks gestation of pregnancy
Basic anatomic survey                          Z36
Uterine size-date discrepancy, second
trimester (S<D)
OB History

Gravidity:    3         Term:   2        Prem:   0         SAB:   0
TOP:          0       Ectopic:  0        Living: 2
Fetal Evaluation

Num Of Fetuses:     1
Fetal Heart         153
Rate(bpm):
Cardiac Activity:   Observed
Presentation:       Cephalic
Placenta:           Posterior, above cervical os
P. Cord Insertion:  Not well visualized

Amniotic Fluid
AFI FV:      Subjectively within normal limits

AFI Sum(cm)     %Tile       Largest Pocket(cm)
17.47           64

RUQ(cm)       RLQ(cm)       LUQ(cm)        LLQ(cm)
7.35
Biometry
BPD:      79.8  mm     G. Age:  32w 0d         27  %    CI:         77.47  %    70 - 86
FL/HC:       20.4  %    19.9 -
HC:        287  mm     G. Age:  31w 4d          4  %    HC/AC:       1.02       0.96 -
AC:      282.1  mm     G. Age:  32w 1d         41  %    FL/BPD:      73.3  %    71 - 87
FL:       58.5  mm     G. Age:  30w 4d          4  %    FL/AC:       20.7  %    20 - 24
HUM:      50.5  mm     G. Age:  29w 4d        < 5  %

Est. FW:    4742   gm          4 lb     39  %
Gestational Age

LMP:           31w 2d        Date:  06/18/15                 EDD:    03/24/16
U/S Today:     31w 4d                                        EDD:    03/22/16
Best:          32w 4d     Det. By:  Early Ultrasound         EDD:    03/15/16
(08/21/15)
Anatomy

Cranium:               Appears normal         LVOT:                   Appears normal
Cavum:                 Appears normal         Aortic Arch:            Appears normal
Ventricles:            Appears normal         Ductal Arch:            Not well visualized
Choroid Plexus:        Appears normal         Diaphragm:              Appears normal
Cerebellum:            Appears normal         Stomach:                Appears normal, left
sided
Posterior Fossa:       Appears normal         Abdomen:                Appears normal
Nuchal Fold:           Not applicable (>20    Abdominal Wall:         Appears nml (cord
wks GA)                                        insert, abd wall)
Face:                  Appears normal         Cord Vessels:           Appears normal (3
(orbits and profile)                           vessel cord)
Lips:                  Appears normal         Kidneys:                Appear normal
Palate:                Not well visualized    Bladder:                Appears normal
Thoracic:              Appears normal         Spine:                  Limited views
appear normal
Heart:                 Appears normal         Upper Extremities:      Visualized
(4CH, axis, and situs
RVOT:                  Appears normal         Lower Extremities:      Visualized

Other:  Fetus appears to be a male. Complete fetal anatomic survey
previously performed in office.
Cervix Uterus Adnexa

Cervix
Not visualized (advanced GA >25wks)

Left Ovary
Within normal limits.

Right Ovary
Within normal limits.

Adnexa:       No abnormality visualized.
Impression

SIUP at 32+4 weeks
Normal interval anatomy; anatomic survey complete; limited
views of DA and palate
Normal amniotic fluid volume
Appropriate interval growth with EFW at the 39th %tile
Recommendations

Follow-up as clinically indicated

## 2018-05-18 ENCOUNTER — Ambulatory Visit (INDEPENDENT_AMBULATORY_CARE_PROVIDER_SITE_OTHER): Payer: No Typology Code available for payment source | Admitting: Neurology

## 2018-05-18 ENCOUNTER — Encounter: Payer: Self-pay | Admitting: Neurology

## 2018-05-18 VITALS — BP 108/63 | HR 90 | Ht 59.0 in | Wt 120.0 lb

## 2018-05-18 DIAGNOSIS — Z808 Family history of malignant neoplasm of other organs or systems: Secondary | ICD-10-CM

## 2018-05-18 DIAGNOSIS — R519 Headache, unspecified: Secondary | ICD-10-CM

## 2018-05-18 DIAGNOSIS — R51 Headache with orthostatic component, not elsewhere classified: Secondary | ICD-10-CM

## 2018-05-18 DIAGNOSIS — H539 Unspecified visual disturbance: Secondary | ICD-10-CM

## 2018-05-18 DIAGNOSIS — G43709 Chronic migraine without aura, not intractable, without status migrainosus: Secondary | ICD-10-CM | POA: Diagnosis not present

## 2018-05-18 DIAGNOSIS — G4484 Primary exertional headache: Secondary | ICD-10-CM

## 2018-05-18 NOTE — Patient Instructions (Signed)
MRI brain   Migraine Headache A migraine headache is an intense, throbbing pain on one side or both sides of the head. Migraines may also cause other symptoms, such as nausea, vomiting, and sensitivity to light and noise. What are the causes? Doing or taking certain things may also trigger migraines, such as:  Alcohol.  Smoking.  Medicines, such as: ? Medicine used to treat chest pain (nitroglycerine). ? Birth control pills. ? Estrogen pills. ? Certain blood pressure medicines.  Aged cheeses, chocolate, or caffeine.  Foods or drinks that contain nitrates, glutamate, aspartame, or tyramine.  Physical activity.  Other things that may trigger a migraine include:  Menstruation.  Pregnancy.  Hunger.  Stress, lack of sleep, too much sleep, or fatigue.  Weather changes.  What increases the risk? The following factors may make you more likely to experience migraine headaches:  Age. Risk increases with age.  Family history of migraine headaches.  Being Caucasian.  Depression and anxiety.  Obesity.  Being a woman.  Having a hole in the heart (patent foramen ovale) or other heart problems.  What are the signs or symptoms? The main symptom of this condition is pulsating or throbbing pain. Pain may:  Happen in any area of the head, such as on one side or both sides.  Interfere with daily activities.  Get worse with physical activity.  Get worse with exposure to bright lights or loud noises.  Other symptoms may include:  Nausea.  Vomiting.  Dizziness.  General sensitivity to bright lights, loud noises, or smells.  Before you get a migraine, you may get warning signs that a migraine is developing (aura). An aura may include:  Seeing flashing lights or having blind spots.  Seeing bright spots, halos, or zigzag lines.  Having tunnel vision or blurred vision.  Having numbness or a tingling feeling.  Having trouble talking.  Having muscle  weakness.  How is this diagnosed? A migraine headache can be diagnosed based on:  Your symptoms.  A physical exam.  Tests, such as CT scan or MRI of the head. These imaging tests can help rule out other causes of headaches.  Taking fluid from the spine (lumbar puncture) and analyzing it (cerebrospinal fluid analysis, or CSF analysis).  How is this treated? A migraine headache is usually treated with medicines that:  Relieve pain.  Relieve nausea.  Prevent migraines from coming back.  Treatment may also include:  Acupuncture.  Lifestyle changes like avoiding foods that trigger migraines.  Follow these instructions at home: Medicines  Take over-the-counter and prescription medicines only as told by your health care provider.  Do not drive or use heavy machinery while taking prescription pain medicine.  To prevent or treat constipation while you are taking prescription pain medicine, your health care provider may recommend that you: ? Drink enough fluid to keep your urine clear or pale yellow. ? Take over-the-counter or prescription medicines. ? Eat foods that are high in fiber, such as fresh fruits and vegetables, whole grains, and beans. ? Limit foods that are high in fat and processed sugars, such as fried and sweet foods. Lifestyle  Avoid alcohol use.  Do not use any products that contain nicotine or tobacco, such as cigarettes and e-cigarettes. If you need help quitting, ask your health care provider.  Get at least 8 hours of sleep every night.  Limit your stress. General instructions   Keep a journal to find out what may trigger your migraine headaches. For example, write down: ?   What you eat and drink. ? How much sleep you get. ? Any change to your diet or medicines.  If you have a migraine: ? Avoid things that make your symptoms worse, such as bright lights. ? It may help to lie down in a dark, quiet room. ? Do not drive or use heavy machinery. ? Ask  your health care provider what activities are safe for you while you are experiencing symptoms.  Keep all follow-up visits as told by your health care provider. This is important. Contact a health care provider if:  You develop symptoms that are different or more severe than your usual migraine symptoms. Get help right away if:  Your migraine becomes severe.  You have a fever.  You have a stiff neck.  You have vision loss.  Your muscles feel weak or like you cannot control them.  You start to lose your balance often.  You develop trouble walking.  You faint. This information is not intended to replace advice given to you by your health care provider. Make sure you discuss any questions you have with your health care provider. Document Released: 07/14/2005 Document Revised: 02/01/2016 Document Reviewed: 12/31/2015 Elsevier Interactive Patient Education  2017 [employer]  

## 2018-05-18 NOTE — Progress Notes (Signed)
[facility]    Provider:  Dr Lucia Gaskins Referring Provider: Georgianne Fick, MD Primary Care Physician:  Georgianne Fick, MD  CC:  headache  HPI:  Lorraine West is a 37 y.o. female here as requested by Dr. Nicholos Johns for headaches.  PMHx migraines started in 2011. Mother has migraines. In 2013 she had a migraine for a month, a steroid dosepak helped. Headaches have been on and off. Recently worsening and daily. Cousin died from a brain tumor. She has 20 headache days a month. Migraines start on the right more posterior then travels to behind the eyes, pulsating, movement makes it worse, +light sensitiviy, a dark room helps, +nausea but no vomiting. 10 migraine days a month. Ongoing for 4-6 months at this frequency. Migraines can be moderate to severe. She has woken up with dull headache and worsenes as the day progresses. A migraine can last 1-3 days.  headaches can be positional and worse with bearing down. + blurry vision changes.FHx cancer, mother and grandparents died from cancers and a 1st cousin died of a malignant brain tumor.   meds tried: propranol, tizanidine  Review of Systems: Patient complains of symptoms per HPI as well as the following symptoms: weight gain, palpitations, murmur, headache, sleepiness  Pertinent negatives and positives per HPI. All others negative.   Social History   Socioeconomic History  . Marital status: Married    Spouse name: Not on file  . Number of children: 3  . Years of education: 28  . Highest education level: Master's degree (e.g., MA, MS, MEng, MEd, MSW, MBA)  Occupational History  . Not on file  Social Needs  . Financial resource strain: Not on file  . Food insecurity:    Worry: Not on file    Inability: Not on file  . Transportation needs:    Medical: Not on file    Non-medical: Not on file  Tobacco Use  . Smoking status: Never Smoker  . Smokeless tobacco: Never Used  Substance and Sexual Activity  .  Alcohol use: No  . Drug use: Never  . Sexual activity: Not on file  Lifestyle  . Physical activity:    Days per week: Not on file    Minutes per session: Not on file  . Stress: Not on file  Relationships  . Social connections:    Talks on phone: Not on file    Gets together: Not on file    Attends religious service: Not on file    Active member of club or organization: Not on file    Attends meetings of clubs or organizations: Not on file    Relationship status: Not on file  . Intimate partner violence:    Fear of current or ex partner: Not on file    Emotionally abused: Not on file    Physically abused: Not on file    Forced sexual activity: Not on file  Other Topics Concern  . 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 Breast cancer Mother 94       d. 58y  . Migraines Mother   . Diabetes Father   . Colon cancer Maternal Grandmother        dx [phone]  . Heart disease Maternal Grandfather   .  Heart Problems Maternal Grandfather   . Cancer Maternal Grandfather        NOS cancer  . Kidney cancer Paternal Grandmother        dx unspecified age  . Lung cancer Paternal Grandfather        dx unspecified age  . Cervical cancer Maternal Aunt        d. 62y  . Brain cancer Cousin 76       maternal 1st cousin d. 33y; NOS brain tumor  . Breast cancer Cousin 70       maternal 1st cousin  . Prostate cancer Cousin 25       maternal 1st cousin d. 25y    Past Medical History:  Diagnosis Date  . Alpha thalassemia (HCC)   . Asthma   . Heart murmur    MVP  . Migraine     Past Surgical History:  Procedure Laterality Date  . NO PAST SURGERIES      Current Outpatient Medications  Medication Sig Dispense Refill  . Ascorbic Acid (VITAMIN C PO) Take by mouth.    Marland Kitchen b complex vitamins  tablet     . Omega-3 Fatty Acids (FISH OIL PO) Take by mouth.    Marland Kitchen PRESCRIPTION MEDICATION IUD    . tiZANidine (ZANAFLEX) 2 MG tablet Take 2 mg by mouth 2 (two) times daily as needed for muscle spasms.     No current facility-administered medications for this visit.     Allergies as of 05/18/2018 - Review Complete 05/18/2018  Allergen Reaction Noted  . Gluten meal Nausea Only 10/08/2016    Vitals: BP 108/63 (BP Location: Right Arm, Patient Position: Sitting)   Pulse 90   Ht 4\' 11"  (1.499 m)   Wt 120 lb (54.4 kg)   LMP 05/04/2018 (Approximate)   BMI 24.24 kg/m  Last Weight:  Wt Readings from Last 1 Encounters:  05/18/18 120 lb (54.4 kg)   Last Height:   Ht Readings from Last 1 Encounters:  05/18/18 4\' 11"  (1.499 m)   Physical exam: Exam: Gen: NAD, conversant, well nourised, well groomed                     CV: RRR, no MRG. No Carotid Bruits. No peripheral edema, warm, nontender Eyes: Conjunctivae clear without exudates or hemorrhage  Neuro: Detailed Neurologic Exam  Speech:    Speech is normal; fluent and spontaneous with normal comprehension.  Cognition:    The patient is oriented to person, place, and time;     recent and remote memory intact;     language fluent;     normal attention, concentration,     fund of knowledge Cranial Nerves:    The pupils are equal, round, and reactive to light. The fundi are normal and spontaneous venous pulsations are present. Visual fields are full to finger confrontation. Extraocular movements are intact. Trigeminal sensation is intact and the muscles of mastication are normal. The face is symmetric. The palate elevates in the midline. Hearing intact. Voice is normal. Shoulder shrug is normal. The tongue has normal motion without fasciculations.   Coordination:    Normal finger to nose and heel to shin. Normal rapid alternating movements.   Gait:    Heel-toe and tandem gait are normal.   Motor Observation:    No asymmetry, no  atrophy, and no involuntary movements noted. Tone:    Normal muscle tone.    Posture:    Posture is normal. normal erect  Strength:    Strength is V/V in the upper and lower limbs.      Sensation: intact to LT     Reflex Exam:  DTR's:    Deep tendon reflexes in the upper and lower extremities are normal bilaterally.   Toes:    The toes are downgoing bilaterally.   Clonus:    Clonus is absent.       Assessment/Plan:  37 year old patient with acute intractable headaches. Likely migraines however needs further workup due to concerning symptoms of changing quality and frequency, extensive FHx of malignancies and brain tumor.  MRI brain due to concerning symptoms of morning headaches, positional headaches worse with valsalva,vision changes, changing frequency/severity/quality, intractable,  to look for space occupying mass, chiari or intracranial hypertension (pseudotumor): MRI of the brain w/wo contrast  Orders Placed This Encounter  Procedures  . MR BRAIN W WO CONTRAST   Cc: Georgianne Fick, MD  Discussed: To prevent or relieve headaches, try the following: Cool Compress. Lie down and place a cool compress on your head.  Avoid headache triggers. If certain foods or odors seem to have triggered your migraines in the past, avoid them. A headache diary might help you identify triggers.  Include physical activity in your daily routine. Try a daily walk or other moderate aerobic exercise.  Manage stress. Find healthy ways to cope with the stressors, such as delegating tasks on your to-do list.  Practice relaxation techniques. Try deep breathing, yoga, massage and visualization.  Eat regularly. Eating regularly scheduled meals and maintaining a healthy diet might help prevent headaches. Also, drink plenty of fluids.  Follow a regular sleep schedule. Sleep deprivation might contribute to headaches Consider biofeedback. With this mind-body technique, you learn to control certain  bodily functions - such as muscle tension, heart rate and blood pressure - to prevent headaches or reduce headache pain.    Proceed to emergency room if you experience new or worsening symptoms or symptoms do not resolve, if you have new neurologic symptoms or if headache is severe, or for any concerning symptom.   Provided education and documentation from American headache Society toolbox including articles on: chronic migraine medication overuse headache, chronic migraines, prevention of migraines, behavioral and other nonpharmacologic treatments for headache.  A total of 60 minutes was spent face-to-face with this patient. Over half this time was spent on counseling patient on the  1. Chronic migraine without aura without status migrainosus, not intractable   2. Acute nonintractable headache, unspecified headache type   3. Morning headache   4. Positional headache   5. Exertional headache   6. Vision changes   7. FHx: brain cancer    diagnosis and different diagnostic and therapeutic options, counseling and coordination of care, risks ans benefits of management, compliance, or risk factor reduction and education.     Naomie Dean, MD  [facility] [address] [place], [place] [address]  Phone [phone] Fax [phone]

## 2018-05-19 ENCOUNTER — Encounter: Payer: Self-pay | Admitting: Neurology

## 2018-05-19 DIAGNOSIS — G43709 Chronic migraine without aura, not intractable, without status migrainosus: Secondary | ICD-10-CM | POA: Insufficient documentation

## 2018-05-20 ENCOUNTER — Telehealth: Payer: Self-pay | Admitting: Neurology

## 2018-05-20 NOTE — Telephone Encounter (Signed)
MR Brain w/wo contrast Dr. Valentino Saxon Focus Berkley Harvey: [id number] (exp. 05/25/18). Patient is scheduled at [facility] for 05/25/18.

## 2018-05-25 ENCOUNTER — Ambulatory Visit: Payer: No Typology Code available for payment source

## 2018-05-25 DIAGNOSIS — Z808 Family history of malignant neoplasm of other organs or systems: Secondary | ICD-10-CM

## 2018-05-25 DIAGNOSIS — R51 Headache with orthostatic component, not elsewhere classified: Secondary | ICD-10-CM

## 2018-05-25 DIAGNOSIS — R519 Headache, unspecified: Secondary | ICD-10-CM

## 2018-05-25 DIAGNOSIS — H539 Unspecified visual disturbance: Secondary | ICD-10-CM

## 2018-05-25 DIAGNOSIS — G4484 Primary exertional headache: Secondary | ICD-10-CM

## 2018-05-25 MED ORDER — GADOBENATE DIMEGLUMINE 529 MG/ML IV SOLN
6.0000 mL | Freq: Once | INTRAVENOUS | Status: AC | PRN
  Administered 2018-05-25: 6 mL via INTRAVENOUS

## 2018-08-02 MED FILL — AZITHROMYCIN 250 MG TABLET: 250 | 5 days supply | Qty: 6 | Fill #0

## 2018-08-30 ENCOUNTER — Ambulatory Visit: Payer: Self-pay | Admitting: Surgery

## 2019-09-20 MED FILL — ESCITALOPRAM 10 MG TABLET: 10 | 30 days supply | Qty: 30 | Fill #0

## 2019-09-20 MED FILL — hydrOXYzine HCL 25 MG TABS: 25 | 30 days supply | Qty: 90 | Fill #0

## 2019-09-23 MED FILL — VIT D2 1.25 MG (50,000 UNIT: 1.25 MG | 84 days supply | Qty: 12 | Fill #0

## 2019-09-26 ENCOUNTER — Telehealth: Payer: Self-pay | Admitting: Hematology

## 2019-09-26 NOTE — Telephone Encounter (Signed)
Scheduled per 3/1 referral. Called and spoke with pt, confirmed 3/17 appt. Pt aware to arrive 30 mins early.

## 2019-10-12 ENCOUNTER — Inpatient Hospital Stay: Payer: No Typology Code available for payment source | Attending: Hematology | Admitting: Hematology

## 2019-10-12 ENCOUNTER — Inpatient Hospital Stay: Payer: No Typology Code available for payment source

## 2019-10-12 ENCOUNTER — Other Ambulatory Visit: Payer: Self-pay

## 2019-10-12 ENCOUNTER — Telehealth: Payer: Self-pay | Admitting: Hematology

## 2019-10-12 VITALS — BP 115/63 | HR 82 | Temp 98.3°F | Resp 18 | Ht 59.0 in | Wt 112.6 lb

## 2019-10-12 DIAGNOSIS — Z801 Family history of malignant neoplasm of trachea, bronchus and lung: Secondary | ICD-10-CM | POA: Insufficient documentation

## 2019-10-12 DIAGNOSIS — Z803 Family history of malignant neoplasm of breast: Secondary | ICD-10-CM | POA: Insufficient documentation

## 2019-10-12 DIAGNOSIS — Z8049 Family history of malignant neoplasm of other genital organs: Secondary | ICD-10-CM | POA: Insufficient documentation

## 2019-10-12 DIAGNOSIS — N920 Excessive and frequent menstruation with regular cycle: Secondary | ICD-10-CM | POA: Insufficient documentation

## 2019-10-12 DIAGNOSIS — D500 Iron deficiency anemia secondary to blood loss (chronic): Secondary | ICD-10-CM | POA: Diagnosis present

## 2019-10-12 DIAGNOSIS — Z808 Family history of malignant neoplasm of other organs or systems: Secondary | ICD-10-CM | POA: Diagnosis not present

## 2019-10-12 DIAGNOSIS — Z8051 Family history of malignant neoplasm of kidney: Secondary | ICD-10-CM | POA: Diagnosis not present

## 2019-10-12 DIAGNOSIS — D582 Other hemoglobinopathies: Secondary | ICD-10-CM

## 2019-10-12 LAB — CBC WITH DIFFERENTIAL/PLATELET
Abs Immature Granulocytes: 0.02 10*3/uL (ref 0.00–0.07)
Basophils Absolute: 0 10*3/uL (ref 0.0–0.1)
Basophils Relative: 0 %
Eosinophils Absolute: 0.1 10*3/uL (ref 0.0–0.5)
Eosinophils Relative: 1 %
HCT: 37.1 % (ref 36.0–46.0)
Hemoglobin: 11.2 g/dL — ABNORMAL LOW (ref 12.0–15.0)
Immature Granulocytes: 0 %
Lymphocytes Relative: 26 %
Lymphs Abs: 2.1 10*3/uL (ref 0.7–4.0)
MCH: 20.9 pg — ABNORMAL LOW (ref 26.0–34.0)
MCHC: 30.2 g/dL (ref 30.0–36.0)
MCV: 69.1 fL — ABNORMAL LOW (ref 80.0–100.0)
Monocytes Absolute: 0.4 10*3/uL (ref 0.1–1.0)
Monocytes Relative: 4 %
Neutro Abs: 5.6 10*3/uL (ref 1.7–7.7)
Neutrophils Relative %: 69 %
Platelets: 339 10*3/uL (ref 150–400)
RBC: 5.37 MIL/uL — ABNORMAL HIGH (ref 3.87–5.11)
RDW: 15.4 % (ref 11.5–15.5)
WBC: 8.2 10*3/uL (ref 4.0–10.5)
nRBC: 0 % (ref 0.0–0.2)

## 2019-10-12 LAB — FERRITIN: Ferritin: 6 ng/mL — ABNORMAL LOW (ref 11–307)

## 2019-10-12 LAB — CMP (CANCER CENTER ONLY)
ALT: <6 U/L (ref 0–44)
AST: 14 U/L — ABNORMAL LOW (ref 15–41)
Albumin: 3.7 g/dL (ref 3.5–5.0)
Alkaline Phosphatase: 88 U/L (ref 38–126)
Anion gap: 10 (ref 5–15)
BUN: 10 mg/dL (ref 6–20)
CO2: 27 mmol/L (ref 22–32)
Calcium: 9 mg/dL (ref 8.9–10.3)
Chloride: 104 mmol/L (ref 98–111)
Creatinine: 0.69 mg/dL (ref 0.44–1.00)
GFR, Est AFR Am: >60 mL/min (ref >60)
GFR, Estimated: >60 mL/min (ref >60)
Glucose, Bld: 81 mg/dL (ref 70–99)
Potassium: 4.1 mmol/L (ref 3.5–5.1)
Sodium: 141 mmol/L (ref 135–145)
Total Bilirubin: 0.3 mg/dL (ref 0.3–1.2)
Total Protein: 7.7 g/dL (ref 6.5–8.1)

## 2019-10-12 LAB — TRANSFERRIN: Transferrin: 288 mg/dL (ref 192–382)

## 2019-10-12 LAB — VITAMIN B12: Vitamin B-12: 242 pg/mL (ref 180–914)

## 2019-10-12 LAB — IRON AND TIBC
Iron: 40 ug/dL — ABNORMAL LOW (ref 41–142)
Saturation Ratios: 11 % — ABNORMAL LOW (ref 21–57)
TIBC: 368 ug/dL (ref 236–444)
UIBC: 328 ug/dL (ref 120–384)

## 2019-10-12 MED ORDER — POLYSACCHARIDE IRON COMPLEX 150 MG PO CAPS: 150.0000 mg | ORAL_CAPSULE | Freq: Two times a day (BID) | ORAL | 3 refills | Status: DC

## 2019-10-12 MED FILL — VIT D2 1.25 MG (50,000 UNIT: 1.25 MG | 84 days supply | Qty: 12 | Fill #0

## 2019-10-12 NOTE — Progress Notes (Signed)
3  HEMATOLOGY/ONCOLOGY CONSULTATION NOTE  Date of Service: 10/12/2019  Patient Care Team: Merrilee Seashore, MD as PCP - General (Internal Medicine)  CHIEF COMPLAINTS/PURPOSE OF CONSULTATION:  Hx of Thalassemia/iron deficiency   HISTORY OF PRESENTING ILLNESS:   Lorraine West is a wonderful 39 y.o. female who has been referred to [place] by Oren Beckmann, NP for evaluation and management of Hx of thalassemia/iron deficiency. The pt reports that she is doing well overall.   The pt reports that she has been iron deficient as long as she can remember. Pt has previously been given folic acid and iron to take. She has discontinued taking po iron because it causes more fatigue for her and some element of constipation. Pt has heavy periods that last about seven days. There has been no known cause found for her menorrhagia but pt has not discussed this with her OB/GYN.   Pt has had three children, all through normal vaginal delivery. Pt has not had had any surgeries and has never needed any blood transfusions. She has no known allergies and works as a Designer, jewellery for [employer]. She does not smoke and does not drink a lot. Pt recently began taking 10 mg of Lexapro for some depressive moods.   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 Pylori infection. Pt was placed on an acid suppressant for long enough to allow her infection to resolve. Now she only takes TUMS as needed for acid reflux or upset stomach. Pt still has abdominal pain sometimes, but nothing similar to the abdominal pain during she was experiencing during her H. Pylori infection.   Her mother was diagnosed with breast cancer at the age of 35 and passed at the age of 40. Pt has tested positive for a genetic mutation with no determined significance. She was previously told that she had BRCA mutation but was found not to have it on her most recent genetic mutation studies. She  notes that all of her siblings also have iron deficiency and her niece may have Beta Thalassemia.   Most recent lab results (09/20/2019) is as follows: Vitamin D 25 hydroxy at 15.4, Hgb at 11.2, HCT at 35.8, Ferritin at 6.1.   On review of systems, pt reports fatigue, menorrhagia, abdominal pain and denies bloody/black stools, gum bleeds, nose bleeds, hematuria, unexpected weight loss and any other symptoms.   On PMHx the pt reports Alpha thalassemia, Depressive moods, Vitamin D deficiency, Iron deficiency, Anemia. On Social Hx the pt reports that she is a non-smoker and does not drink a significant amount of alcohol. On Family Hx the pt reports her mother was diagnosed and deceased from breast cancer in her 49's and all of her siblings have iron deficiency.    MEDICAL HISTORY:  Past Medical History:  Diagnosis Date  . Alpha thalassemia ([place])   . Asthma   . Heart murmur    MVP  . Migraine   Hx of Vitamin D deficiency BRIP1 Heterozygous mutation  SURGICAL HISTORY: Past Surgical History:  Procedure Laterality Date  . NO PAST SURGERIES      SOCIAL HISTORY: Social History   Socioeconomic History  . Marital status: Married    Spouse name: Not on file  . Number of children: 3  . Years of education: 59  . Highest education level: Master's degree (e.g., MA, MS, MEng, MEd, MSW, MBA)  Occupational History  . Not on file  Tobacco Use  .  Smoking status: Never Smoker  . Smokeless tobacco: Never Used  Substance and Sexual Activity  . Alcohol use: No  . Drug use: Never  . Sexual activity: Not on file  Other Topics Concern  . 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 Difficulty of Paying Living Expenses:   Food Insecurity:   . Worried About Charity fundraiser in the Last  Year:   . Arboriculturist in the Last Year:   Transportation Needs:   . Film/video editor (Medical):   Marland Kitchen Lack of Transportation (Non-Medical):   Physical Activity:   . Days of Exercise per Week:   . Minutes of Exercise per Session:   Stress:   . Feeling of Stress :   Social Connections:   . Frequency of Communication with Friends and Family:   . Frequency of Social Gatherings with Friends and Family:   . Attends Religious Services:   . Active Member of Clubs or Organizations:   . Attends Archivist Meetings:   Marland Kitchen Marital Status:   Intimate Partner Violence:   . Fear of Current or Ex-Partner:   . Emotionally Abused:   Marland Kitchen Physically Abused:   . Sexually Abused:     FAMILY HISTORY: Family History  Problem Relation Age of Onset  . Breast cancer Mother 43       d. 68y  . Migraines Mother   . Diabetes Father   . Colon cancer Maternal Grandmother        dx [phone]  . Heart disease Maternal Grandfather   . Heart Problems Maternal Grandfather   . Cancer Maternal Grandfather        NOS cancer  . Kidney cancer Paternal Grandmother        dx unspecified age  . Lung cancer Paternal Grandfather        dx unspecified age  . Cervical cancer Maternal Aunt        d. 62y  . Brain cancer Cousin 64       maternal 1st cousin d. 33y; NOS brain tumor  . Breast cancer Cousin 14       maternal 1st cousin  . Prostate cancer Cousin 25       maternal 1st cousin d. 25y    ALLERGIES:  is allergic to gluten meal.  MEDICATIONS:  Current Outpatient Medications  Medication Sig Dispense Refill  . Ascorbic Acid (VITAMIN C PO) Take by mouth.    Marland Kitchen b complex vitamins tablet     . iron polysaccharides (NIFEREX) 150 MG capsule Take 1 capsule (150 mg total) by mouth 2 (two) times daily. 60 capsule 3  . Omega-3 Fatty Acids (FISH OIL PO) Take by mouth.    Marland Kitchen PRESCRIPTION MEDICATION IUD    . tiZANidine (ZANAFLEX) 2 MG tablet Take 2 mg by mouth 2 (two) times daily as needed for muscle  spasms.     No current facility-administered medications for this visit.    REVIEW OF SYSTEMS:    10 Point review of Systems was done is negative except as noted above.  PHYSICAL EXAMINATION: ECOG PERFORMANCE STATUS: 1 - Symptomatic but completely ambulatory  . Vitals:   10/12/19 1308  BP: 115/63  Pulse: 82  Resp: 18  Temp: 98.3 F (36.8 C)  SpO2: 100%   Filed Weights   10/12/19 1308  Weight: 112 lb 9.6 oz (51.1 kg)   .Body mass index is 22.74 kg/m.  GENERAL:alert, in no acute distress and comfortable SKIN: no acute rashes, no significant lesions EYES: conjunctiva are pink and non-injected, sclera anicteric OROPHARYNX: MMM, no exudates, no oropharyngeal erythema or ulceration NECK: supple, no JVD LYMPH:  no palpable lymphadenopathy in the cervical, axillary or inguinal regions LUNGS: clear to auscultation b/l with normal respiratory effort HEART: regular rate & rhythm ABDOMEN:  normoactive bowel sounds , non tender, not distended. Extremity: no pedal edema PSYCH: alert & oriented x 3 with fluent speech NEURO: no focal motor/sensory deficits  LABORATORY DATA:  I have reviewed the data as listed  . CBC Latest Ref Rng & Units 10/12/2019 03/09/2016 03/08/2016  WBC 4.0 - 10.5 K/uL 8.2 13.6(H) 12.0(H)  Hemoglobin 12.0 - 15.0 g/dL 11.2(L) 9.3(L) 11.1(L)  Hematocrit 36.0 - 46.0 % 37.1 27.6(L) 33.9(L)  Platelets 150 - 400 K/uL 339 132(L) 166    . CMP Latest Ref Rng & Units 10/12/2019  Glucose 70 - 99 mg/dL 81  BUN 6 - 20 mg/dL 10  Creatinine 0.44 - 1.00 mg/dL 0.69  Sodium 135 - 145 mmol/L 141  Potassium 3.5 - 5.1 mmol/L 4.1  Chloride 98 - 111 mmol/L 104  CO2 22 - 32 mmol/L 27  Calcium 8.9 - 10.3 mg/dL 9.0  Total Protein 6.5 - 8.1 g/dL 7.7  Total Bilirubin 0.3 - 1.2 mg/dL 0.3  Alkaline Phos 38 - 126 U/L 88  AST 15 - 41 U/L 14(L)  ALT 0 - 44 U/L <6   Component     Latest Ref Rng & Units 10/12/2019  HGB F     0.0 - 2.0 % 0.0  Hgb A     96.4 - 98.8 % 97.4  Hgb  A2     1.8 - 3.2 % 2.6  HGB S     0.0 % 0.0  Interpretation, Hgb Fract      Comment  Iron     41 - 142 ug/dL 40 (L)  TIBC     236 - 444 ug/dL 368  Saturation Ratios     21 - 57 % 11 (L)  UIBC     120 - 384 ug/dL 328  Folate, Hemolysate     Not Estab. ng/mL 274.0  HCT     34.0 - 46.6 % 36.8  Folate, RBC     >498 ng/mL 745  Vitamin B12     180 - 914 pg/mL 242  Transferrin     192 - 382 mg/dL 288  Ferritin     11 - 307 ng/mL 6 (L)    RADIOGRAPHIC STUDIES: I have personally reviewed the radiological images as listed and agreed with the findings in the report. No results found.  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  -Advised pt that Hgb 11.2 may be her baseline due to her Thalessemia trait  -Pt likely has Alpha Thalassemia minor based on labs and history - will confirm with labs today  -Advised pt that Folic Acid has no storage forms in the body so she would need ongoing Folic Acid supplementation due  to thalassemia. -Advised pt that Hgb at 11.2 would not cause fatigue by itself. Some element of Iron and Vitamin D deficiency may contribute to fatigue.  -Advised pt that Paragard IUD may cause more cramping and bleeding than Mirena IUD. Pt should discuss further her OB/GYN. -Advised pt that it may take months to correct Iron deficiency with po Iron and may not fully correct due to ongoing menstrual losses -Recommend IV Iron transfusion due to previous po Iron intolerance and continued menstrual losses - pt would prefer try po iron again  -Recommend pt f/u with her OB/GYN at [facility] for menorrhagia management -Rx 150 mg Iron Polysaccharide po daily -Will get labs today  -Will see back in 4 months with labs  . Orders Placed This Encounter  Procedures    . Iron and TIBC    Standing Status:   Future    Number of Occurrences:   1    Standing Expiration Date:   10/11/2020  . Ferritin    Standing Status:   Future    Number of Occurrences:   1    Standing Expiration Date:   10/11/2020  . Transferrin    Standing Status:   Future    Number of Occurrences:   1    Standing Expiration Date:   10/11/2020  . CBC with Differential/Platelet    Standing Status:   Future    Number of Occurrences:   1    Standing Expiration Date:   11/15/2020  . CMP ([place] only)    Standing Status:   Future    Number of Occurrences:   1    Standing Expiration Date:   10/11/2020  . Vitamin B12    Standing Status:   Future    Number of Occurrences:   1    Standing Expiration Date:   10/11/2020  . Folate RBC    Standing Status:   Future    Number of Occurrences:   1    Standing Expiration Date:   10/11/2020  . Hgb Fractionation Cascade    Standing Status:   Future    Number of Occurrences:   1    Standing Expiration Date:   10/11/2020  . Alpha-Thalassemia GenotypR    Standing Status:   Future    Number of Occurrences:   1    Standing Expiration Date:   10/11/2020  . CBC with Differential/Platelet    Standing Status:   Future    Standing Expiration Date:   11/15/2020  . Ferritin    Standing Status:   Future    Standing Expiration Date:   10/11/2020  . Iron and TIBC    Standing Status:   Future    Standing Expiration Date:   10/11/2020     FOLLOW UP: Labs today RTC with Dr Irene Limbo with labs in 4 months Patient to call her GYN MD from [facility] for followup to address menorrhagia    All of the patients questions were answered with apparent satisfaction. The patient knows to call the clinic with any problems, questions or concerns.  I spent 30 mins counseling the patient face to face. The total time spent in the appointment was 45 minutes and more than 50% was on counseling and direct patient cares.    Sullivan Lone MD [place] AAHIVMS [facility] Hematology/Oncology  [facility]  (Office):       [phone] (Work cell):  [phone] (Fax):  [phone]  10/12/2019 4:39 PM  I, Yevette Edwards, am acting as a scribe for Dr. Sullivan Lone.   .I have reviewed the above documentation for accuracy and completeness, and I agree with the above. Brunetta Genera MD

## 2019-10-12 NOTE — Patient Instructions (Signed)
Thank you for choosing [facility] to provide your oncology and hematology care.   Should you have questions after your visit to the [facility] ([facility]), please contact this office at [phone] between 8:30 AM and 4:30 PM.  Voice mails left after 4:00 PM may not be returned until the following business day.  Calls received after 4:30 PM will be answered by an off-site Nurse Triage Line.    Prescription Refills:  Please have your pharmacy contact us directly for most prescription requests.  Contact the office directly for refills of narcotics (pain medications). Allow 48-72 hours for refills.  Appointments: Please contact the [facility] scheduling department [phone] for questions regarding [facility] appointment scheduling.  Contact the schedulers with any scheduling changes so that your appointment can be rescheduled in a timely manner.   Central Scheduling for [place] [phone] - Call to schedule procedures such as PET scans, CT scans, MRI, Ultrasound, etc.  To afford each patient quality time with our providers, please arrive 30 minutes before your scheduled appointment time.  If you arrive late for your appointment, you may be asked to reschedule.  We strive to give you quality time with our providers, and arriving late affects you and other patients whose appointments are after yours. If you are a no show for multiple scheduled visits, you may be dismissed from the clinic at the providers discretion.     Resources: [facility] Social Workers [phone] for additional information on assistance programs or assistance connecting with community support programs   [place] DSS  336-641-3447: Information regarding food stamps, Medicaid, and utility assistance [place] 336-333-6589   [place] [employer]'s shared-ride transportation service for eligible riders who have a disability that prevents them from riding the fixed route bus.   Medicare Rights Center  800-333-4114 Helps people with Medicare understand their rights and benefits, navigate the Medicare system, and secure the quality healthcare they deserve American Cancer Society 800-227-2345 Assists patients locate various types of support and financial assistance Cancer Care: 1-800-813-HOPE (4673) Provides financial assistance, online support groups, medication/co-pay assistance.   Transportation Assistance for appointments at [facility]: Transportation Coordinator [phone]  Again, thank you for choosing  Cancer Center for your care.       

## 2019-10-12 NOTE — Telephone Encounter (Signed)
Scheduled per [place]. Patient declined printout  

## 2019-10-13 LAB — FOLATE RBC
Folate, Hemolysate: 274 ng/mL
Folate, RBC: 745 ng/mL (ref >498)
Hematocrit: 36.8 % (ref 34.0–46.6)

## 2019-10-14 LAB — HGB FRACTIONATION CASCADE
Hgb A2: 2.6 % (ref 1.8–3.2)
Hgb A: 97.4 % (ref 96.4–98.8)
Hgb F: 0 % (ref 0.0–2.0)
Hgb S: 0 %

## 2019-10-25 LAB — ALPHA-THALASSEMIA GENOTYPR

## 2019-10-26 MED FILL — ESCITALOPRAM 10 MG TABLET: 10 | 30 days supply | Qty: 30 | Fill #1

## 2019-10-26 MED FILL — POLY-IRON 150 MG CAPSULE: 150 | 30 days supply | Qty: 60 | Fill #0

## 2019-11-18 ENCOUNTER — Other Ambulatory Visit: Payer: Self-pay | Admitting: Nurse Practitioner

## 2019-11-18 DIAGNOSIS — Z803 Family history of malignant neoplasm of breast: Secondary | ICD-10-CM

## 2019-11-18 DIAGNOSIS — Z1231 Encounter for screening mammogram for malignant neoplasm of breast: Secondary | ICD-10-CM

## 2019-11-23 ENCOUNTER — Ambulatory Visit: Payer: No Typology Code available for payment source

## 2019-11-25 ENCOUNTER — Ambulatory Visit
Admission: RE | Admit: 2019-11-25 | Discharge: 2019-11-25 | Disposition: A | Discharge status: Home / Self Care | Payer: No Typology Code available for payment source | Source: Ambulatory Visit | Attending: Nurse Practitioner | Admitting: Nurse Practitioner

## 2019-11-25 ENCOUNTER — Other Ambulatory Visit: Payer: Self-pay

## 2019-11-25 DIAGNOSIS — Z803 Family history of malignant neoplasm of breast: Secondary | ICD-10-CM

## 2019-11-25 DIAGNOSIS — Z1231 Encounter for screening mammogram for malignant neoplasm of breast: Secondary | ICD-10-CM

## 2019-11-25 MED FILL — ESCITALOPRAM 10 MG TABLET: 10 | 30 days supply | Qty: 30 | Fill #2

## 2019-11-28 ENCOUNTER — Other Ambulatory Visit: Payer: Self-pay | Admitting: Nurse Practitioner

## 2019-11-28 DIAGNOSIS — R928 Other abnormal and inconclusive findings on diagnostic imaging of breast: Secondary | ICD-10-CM

## 2019-12-07 ENCOUNTER — Other Ambulatory Visit: Payer: Self-pay

## 2019-12-07 ENCOUNTER — Ambulatory Visit
Admission: RE | Admit: 2019-12-07 | Discharge: 2019-12-07 | Disposition: A | Discharge status: Home / Self Care | Payer: No Typology Code available for payment source | Source: Ambulatory Visit | Attending: Nurse Practitioner | Admitting: Nurse Practitioner

## 2019-12-07 DIAGNOSIS — R928 Other abnormal and inconclusive findings on diagnostic imaging of breast: Secondary | ICD-10-CM

## 2019-12-21 MED FILL — ESCITALOPRAM 10 MG TABLET: 10 | 30 days supply | Qty: 30 | Fill #3

## 2019-12-29 MED FILL — VIT D2 1.25 MG (50,000 UNIT: 1.25 MG | 84 days supply | Qty: 12 | Fill #0

## 2020-01-27 MED FILL — ESCITALOPRAM 10 MG TABLET: 10 | 30 days supply | Qty: 30 | Fill #4

## 2020-02-09 ENCOUNTER — Ambulatory Visit: Payer: No Typology Code available for payment source | Admitting: Hematology

## 2020-02-09 ENCOUNTER — Other Ambulatory Visit: Payer: No Typology Code available for payment source

## 2020-03-02 MED FILL — ESCITALOPRAM 10 MG TABLET: 10 | 30 days supply | Qty: 30 | Fill #5

## 2020-06-19 ENCOUNTER — Ambulatory Visit: Payer: No Typology Code available for payment source | Admitting: Neurology

## 2020-06-19 ENCOUNTER — Other Ambulatory Visit: Payer: Self-pay | Admitting: Neurology

## 2020-06-19 ENCOUNTER — Encounter: Payer: Self-pay | Admitting: Neurology

## 2020-06-19 ENCOUNTER — Other Ambulatory Visit: Payer: Self-pay

## 2020-06-19 VITALS — BP 113/72 | HR 77 | Ht 59.0 in | Wt 120.0 lb

## 2020-06-19 DIAGNOSIS — G43709 Chronic migraine without aura, not intractable, without status migrainosus: Secondary | ICD-10-CM | POA: Diagnosis not present

## 2020-06-19 MED ORDER — TOPIRAMATE 50 MG PO TABS
100.0000 mg | ORAL_TABLET | Freq: Two times a day (BID) | ORAL | 6 refills | Status: DC
Start: 2020-06-19 — End: 2020-06-19

## 2020-06-19 MED ORDER — FREMANEZUMAB-VFRM 225 MG/1.5ML ~~LOC~~ SOSY: 675.0000 mg | PREFILLED_SYRINGE | Freq: Once | SUBCUTANEOUS | Status: DC

## 2020-06-19 MED ORDER — RIZATRIPTAN BENZOATE 10 MG PO TBDP
10.0000 mg | ORAL_TABLET | ORAL | 11 refills | Status: DC | PRN
Start: 2020-06-19 — End: 2020-06-19

## 2020-06-19 MED ORDER — ONDANSETRON 4 MG PO TBDP
4.0000 mg | ORAL_TABLET | Freq: Three times a day (TID) | ORAL | 3 refills | Status: DC | PRN
Start: 2020-06-19 — End: 2020-06-19

## 2020-06-19 MED ORDER — TOPIRAMATE 50 MG PO TABS
100.0000 mg | ORAL_TABLET | Freq: Every day | ORAL | 6 refills | Status: DC
Start: 2020-06-19 — End: 2020-06-19

## 2020-06-19 MED FILL — TOPIRAMATE 50 MG TABLET: 50 | 30 days supply | Qty: 60 | Fill #0

## 2020-06-19 MED FILL — ONDANSETRON ODT 4 MG TABLET: 4 | 10 days supply | Qty: 30 | Fill #0

## 2020-06-19 MED FILL — RIZATRIPTAN 10 MG ODT: 10 | 15 days supply | Qty: 9 | Fill #0

## 2020-06-19 NOTE — Progress Notes (Addendum)
[facility]    Provider:  Dr Lucia Gaskins Referring Provider: Georgianne Fick, MD Primary Care Physician:  Georgianne Fick, MD  CC:  Headache  HPI11/23/2021: Here for new equest of headaches. Worsening. Headaches now daily. PMHx migraines since 2011. MRI of the brain was normal (reviewed images) from 05/25/2018: When we saw her last she was worried about her cousin who had a brain tumor and wanted an MRI which was normal(personally reviewed images). Today back to discuss migraine management. Migraines start on the right side and travels behinds the eyes but can start on the left behind the eye as well, photophobia/phonoohobia,nausea but no vomiting, can last 24-72 hours untreated and recently she feels they are continuous. Daily headaches, at least 15 migraine days a month that are moderately severe to severe, OTC like excedrin do not help, a dark room may help. No aura. No other focal neurologic deficits, associated symptoms, inciting events or modifiable factors.  Cbc with slight anemia, cmp unremarkable   HPI 04/2018:  Lorraine West is a 39 y.o. female here as requested by Dr. Nicholos Johns for headaches.  PMHx migraines started in 2011. Mother has migraines. In 2013 she had a migraine for a month, a steroid dosepak helped. Headaches have been on and off. Recently worsening and daily. Cousin died from a brain tumor. She has 20 headache days a month. Migraines start on the right more posterior then travels to behind the eyes, pulsating, movement makes it worse, +light sensitiviy, a dark room helps, +nausea but no vomiting. 10 migraine days a month. Ongoing for 4-6 months at this frequency. Migraines can be moderate to severe. She has woken up with dull headache and worsenes as the day progresses. A migraine can last 1-3 days.  headaches can be positional and worse with bearing down. + blurry vision changes.FHx cancer, mother and grandparents died from cancers and a 1st cousin  died of a malignant brain tumor.   meds tried: propranol, tizanidine, tylenol, celebrex, benadryl, ibuprofen, zofran,tizanidine,    Review of Systems: Patient complains of symptoms per HPI as well as the following symptoms: headaches. Pertinent negatives and positives per HPI. All others negative    Social History   Socioeconomic History  . Marital status: Married    Spouse name: Not on file  . Number of children: 3  . Years of education: 28  . Highest education level: Master's degree (e.g., MA, MS, MEng, MEd, MSW, MBA)  Occupational History  . Not on file  Tobacco Use  . Smoking status: Never Smoker  . Smokeless tobacco: Never Used  Vaping Use  . Vaping Use: Never used  Substance and Sexual Activity  . Alcohol use: No  . Drug use: Never  . Sexual activity: Not on file  Other Topics Concern  . 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   Right handed   Lives at home with her children    Currently separated, not legally   Caffeine: 1 cup of coffee/tea daily   Social Determinants of Health   Financial Resource Strain:   . Difficulty of Paying Living Expenses: Not on file  Food Insecurity:   . Worried About Programme researcher, broadcasting/film/video in the Last Year: Not on file  . Ran Out of Food in the Last Year: Not on file  Transportation Needs:   . Lack of Transportation (Medical): Not on file  . Lack of Transportation (Non-Medical): Not on file  Physical Activity:   . Days of  Exercise per Week: Not on file  . Minutes of Exercise per Session: Not on file  Stress:   . Feeling of Stress : Not on file  Social Connections:   . Frequency of Communication with Friends and Family: Not on file  . Frequency of Social Gatherings with Friends and Family: Not on file  . Attends Religious Services: Not on file  . Active Member of Clubs or Organizations: Not on file  . Attends Banker Meetings: Not on file  . Marital Status: Not on file  Intimate Partner Violence:     . Fear of Current or Ex-Partner: Not on file  . Emotionally Abused: Not on file  . Physically Abused: Not on file  . Sexually Abused: Not on file    Family History  Problem Relation Age of Onset  . Breast cancer Mother 16       d. 46y  . Migraines Mother   . Diabetes Father   . Colon cancer Maternal Grandmother        dx [phone]  . Heart disease Maternal Grandfather   . Heart Problems Maternal Grandfather   . Cancer Maternal Grandfather        NOS cancer  . Kidney cancer Paternal Grandmother        dx unspecified age  . Lung cancer Paternal Grandfather        dx unspecified age  . Cervical cancer Maternal Aunt        d. 62y  . Brain cancer Cousin 49       maternal 1st cousin d. 33y; NOS brain tumor  . Breast cancer Cousin 40       maternal 1st cousin  . Prostate cancer Cousin 25       maternal 1st cousin d. 25y    Past Medical History:  Diagnosis Date  . Alpha thalassemia (HCC)   . Asthma   . Heart murmur    MVP  . Migraine     Past Surgical History:  Procedure Laterality Date  . NO PAST SURGERIES      Current Outpatient Medications  Medication Sig Dispense Refill  . iron polysaccharides (NIFEREX) 150 MG capsule Take 1 capsule (150 mg total) by mouth 2 (two) times daily. 60 capsule 3  . PRESCRIPTION MEDICATION IUD    . ondansetron (ZOFRAN-ODT) 4 MG disintegrating tablet Take 1 tablet (4 mg total) by mouth every 8 (eight) hours as needed for nausea. 30 tablet 3  . rizatriptan (MAXALT-MLT) 10 MG disintegrating tablet Take 1 tablet (10 mg total) by mouth as needed for migraine. May repeat in 2 hours if needed 9 tablet 11  . topiramate (TOPAMAX) 50 MG tablet Take 2 tablets (100 mg total) by mouth at bedtime. 60 tablet 6   Current Facility-Administered Medications  Medication Dose Route Frequency Provider Last Rate Last Admin  . Fremanezumab-vfrm SOSY 675 mg  675 mg Subcutaneous Once Anson Fret, MD        Allergies as of 06/19/2020 - Review Complete  06/19/2020  Allergen Reaction Noted  . Gluten meal Nausea Only 10/08/2016    Vitals: BP 113/72 (BP Location: Left Arm, Patient Position: Sitting)   Pulse 77   Ht 4\' 11"  (1.499 m)   Wt 120 lb (54.4 kg)   Breastfeeding No   BMI 24.24 kg/m  Last Weight:  Wt Readings from Last 1 Encounters:  06/19/20 120 lb (54.4 kg)   Last Height:   Ht Readings from Last 1 Encounters:  06/19/20 4\' 11"  (1.499 m)   Physical exam: Exam: Gen: NAD, conversant, well nourised, well groomed                     CV: RRR, no MRG. No Carotid Bruits. No peripheral edema, warm, nontender Eyes: Conjunctivae clear without exudates or hemorrhage  Neuro: Detailed Neurologic Exam  Speech:    Speech is normal; fluent and spontaneous with normal comprehension.  Cognition:    The patient is oriented to person, place, and time;     recent and remote memory intact;     language fluent;     normal attention, concentration,     fund of knowledge Cranial Nerves:    The pupils are equal, round, and reactive to light. The fundi are normal and spontaneous venous pulsations are present. Visual fields are full to finger confrontation. Extraocular movements are intact. Trigeminal sensation is intact and the muscles of mastication are normal. The face is symmetric. The palate elevates in the midline. Hearing intact. Voice is normal. Shoulder shrug is normal. The tongue has normal motion without fasciculations.   Coordination:    Normal finger to nose and heel to shin. Normal rapid alternating movements.   Gait:    Heel-toe and tandem gait are normal.   Motor Observation:    No asymmetry, no atrophy, and no involuntary movements noted. Tone:    Normal muscle tone.    Posture:    Posture is normal. normal erect    Strength:    Strength is V/V in the upper and lower limbs.      Sensation: intact to LT     Reflex Exam:  DTR's:    Deep tendon reflexes in the upper and lower extremities are normal bilaterally.     Toes:    The toes are downgoing bilaterally.   Clonus:    Clonus is absent.  Assessment/Plan:  39 year old patient with chronic migraines  MRI brain was normal 2019 Will start Topiramate as preventative Will bridge her with ajovy x 3, that works faster and can give her some relief while the topamax is starting to work Acute: rizatriptan and ondansetron Discussed teratogenicity do NOT get pregnant  Meds ordered this encounter  Medications  . rizatriptan (MAXALT-MLT) 10 MG disintegrating tablet    Sig: Take 1 tablet (10 mg total) by mouth as needed for migraine. May repeat in 2 hours if needed    Dispense:  9 tablet    Refill:  11  . Fremanezumab-vfrm SOSY 675 mg  . ondansetron (ZOFRAN-ODT) 4 MG disintegrating tablet    Sig: Take 1 tablet (4 mg total) by mouth every 8 (eight) hours as needed for nausea.    Dispense:  30 tablet    Refill:  3  . DISCONTD: topiramate (TOPAMAX) 50 MG tablet    Sig: Take 2 tablets (100 mg total) by mouth 2 (two) times daily.    Dispense:  60 tablet    Refill:  6  . topiramate (TOPAMAX) 50 MG tablet    Sig: Take 2 tablets (100 mg total) by mouth at bedtime.    Dispense:  60 tablet    Refill:  6    Please cancel prior topamax prescription was in error    Cc: Georgianne Fickamachandran, Ajith, MD  Discussed: To prevent or relieve headaches, try the following: Cool Compress. Lie down and place a cool compress on your head.  Avoid headache triggers. If certain foods or odors seem to have  triggered your migraines in the past, avoid them. A headache diary might help you identify triggers.  Include physical activity in your daily routine. Try a daily walk or other moderate aerobic exercise.  Manage stress. Find healthy ways to cope with the stressors, such as delegating tasks on your to-do list.  Practice relaxation techniques. Try deep breathing, yoga, massage and visualization.  Eat regularly. Eating regularly scheduled meals and maintaining a healthy diet might  help prevent headaches. Also, drink plenty of fluids.  Follow a regular sleep schedule. Sleep deprivation might contribute to headaches Consider biofeedback. With this mind-body technique, you learn to control certain bodily functions -- such as muscle tension, heart rate and blood pressure -- to prevent headaches or reduce headache pain.    Proceed to emergency room if you experience new or worsening symptoms or symptoms do not resolve, if you have new neurologic symptoms or if headache is severe, or for any concerning symptom.   Provided education and documentation from American headache Society toolbox including articles on: chronic migraine medication overuse headache, chronic migraines, prevention of migraines, behavioral and other nonpharmacologic treatments for headache.  A total of 60 minutes was spent face-to-face with this patient. Over half this time was spent on counseling patient on the  1. Chronic migraine without aura without status migrainosus, not intractable    diagnosis and different diagnostic and therapeutic options, counseling and coordination of care, risks ans benefits of management, compliance, or risk factor reduction and education.     Naomie Dean, MD  [facility] [address] [place], [place] [address]  Phone [phone] Fax [phone]

## 2020-06-19 NOTE — Patient Instructions (Addendum)
STart Topiramate at bedtime 1 pill . In 2 weeks increase to 2 pills ( ) PREVENTION Started AJOVY - once Acutely- Rizatriptan: Please take one tablet at the onset of your headache. If it does not improve the symptoms please take one additional tablet. Do not take more then 2 tablets in 24hrs. Do not take use more then 2 to 3 times in a week. For nausea: Ondansetron  Ondansetron oral dissolving tablet What is this medicine? ONDANSETRON (on DAN se tron) is used to treat nausea and vomiting caused by chemotherapy. It is also used to prevent or treat nausea and vomiting after surgery. This medicine may be used for other purposes; ask your health care provider or pharmacist if you have questions. COMMON BRAND NAME(S): Zofran ODT What should I tell my health care provider before I take this medicine? They need to know if you have any of these conditions:  heart disease  history of irregular heartbeat  liver disease  low levels of magnesium or potassium in the blood  an unusual or allergic reaction to ondansetron, granisetron, other medicines, foods, dyes, or preservatives  pregnant or trying to get pregnant  breast-feeding How should I use this medicine? These tablets are made to dissolve in the mouth. Do not try to push the tablet through the foil backing. With dry hands, peel away the foil backing and gently remove the tablet. Place the tablet in the mouth and allow it to dissolve, then swallow. While you may take these tablets with water, it is not necessary to do so. Talk to your pediatrician regarding the use of this medicine in children. Special care may be needed. Overdosage: If you think you have taken too much of this medicine contact a poison control center or emergency room at once. NOTE: This medicine is only for you. Do not share this medicine with others. What if I miss a dose? If you miss a dose, take it as soon as you can. If it is almost time for your next dose, take  only that dose. Do not take double or extra doses. What may interact with this medicine? Do not take this medicine with any of the following medications:  apomorphine  certain medicines for fungal infections like fluconazole, itraconazole, ketoconazole, posaconazole, voriconazole  cisapride  dronedarone  pimozide  thioridazine This medicine may also interact with the following medications:  carbamazepine  certain medicines for depression, anxiety, or psychotic disturbances  fentanyl  linezolid  MAOIs like Carbex, Eldepryl, Marplan, Nardil, and Parnate  methylene blue (injected into a vein)  other medicines that prolong the QT interval (cause an abnormal heart rhythm) like dofetilide, ziprasidone  phenytoin  rifampicin  tramadol This list may not describe all possible interactions. Give your health care provider a list of all the medicines, herbs, non-prescription drugs, or dietary supplements you use. Also tell them if you smoke, drink alcohol, or use illegal drugs. Some items may interact with your medicine. What should I watch for while using this medicine? Check with your doctor or health care professional as soon as you can if you have any sign of an allergic reaction. What side effects may I notice from receiving this medicine? Side effects that you should report to your doctor or health care professional as soon as possible:  allergic reactions like skin rash, itching or hives, swelling of the face, lips, or tongue  breathing problems  confusion  dizziness  fast or irregular heartbeat  feeling faint or lightheaded, falls  fever and  chills  loss of balance or coordination  seizures  sweating  swelling of the hands and feet  tightness in the chest  tremors  unusually weak or tired Side effects that usually do not require medical attention (report to your doctor or health care professional if they continue or are bothersome):  constipation or  diarrhea  headache This list may not describe all possible side effects. Call your doctor for medical advice about side effects. You may report side effects to FDA at 1-800-FDA-1088. Where should I keep my medicine? Keep out of the reach of children. Store between 2 and 30 degrees C (36 and 86 degrees F). Throw away any unused medicine after the expiration date. NOTE: This sheet is a summary. It may not cover all possible information. If you have questions about this medicine, talk to your doctor, pharmacist, or health care provider.  2020 Elsevier/Gold Standard (2018-07-06 07:14:10) Rizatriptan disintegrating tablets What is this medicine? RIZATRIPTAN (rye za TRIP tan) is used to treat migraines with or without aura. An aura is a strange feeling or visual disturbance that warns you of an attack. It is not used to prevent migraines. This medicine may be used for other purposes; ask your health care provider or pharmacist if you have questions. COMMON BRAND NAME(S): Maxalt-MLT What should I tell my health care provider before I take this medicine? They need to know if you have any of these conditions:  cigarette smoker  circulation problems in fingers and toes  diabetes  heart disease  high blood pressure  high cholesterol  history of irregular heartbeat  history of stroke  kidney disease  liver disease  stomach or intestine problems  an unusual or allergic reaction to rizatriptan, other medicines, foods, dyes, or preservatives  pregnant or trying to get pregnant  breast-feeding How should I use this medicine? Take this medicine by mouth. Follow the directions on the prescription label. Leave the tablet in the sealed blister pack until you are ready to take it. With dry hands, open the blister and gently remove the tablet. If the tablet breaks or crumbles, throw it away and take a new tablet out of the blister pack. Place the tablet in the mouth and allow it to dissolve,  and then swallow. Do not cut, crush, or chew this medicine. You do not need water to take this medicine. Do not take it more often than directed. Talk to your pediatrician regarding the use of this medicine in children. While this drug may be prescribed for children as young as 6 years for selected conditions, precautions do apply. Overdosage: If you think you have taken too much of this medicine contact a poison control center or emergency room at once. NOTE: This medicine is only for you. Do not share this medicine with others. What if I miss a dose? This does not apply. This medicine is not for regular use. What may interact with this medicine? Do not take this medicine with any of the following medicines:  certain medicines for migraine headache like almotriptan, eletriptan, frovatriptan, naratriptan, rizatriptan, sumatriptan, zolmitriptan  ergot alkaloids like dihydroergotamine, ergonovine, ergotamine, methylergonovine  MAOIs like Carbex, Eldepryl, Marplan, Nardil, and Parnate This medicine may also interact with the following medications:  certain medicines for depression, anxiety, or psychotic disorders  propranolol This list may not describe all possible interactions. Give your health care provider a list of all the medicines, herbs, non-prescription drugs, or dietary supplements you use. Also tell them if you smoke, drink  alcohol, or use illegal drugs. Some items may interact with your medicine. What should I watch for while using this medicine? Visit your healthcare professional for regular checks on your progress. Tell your healthcare professional if your symptoms do not start to get better or if they get worse. You may get drowsy or dizzy. Do not drive, use machinery, or do anything that needs mental alertness until you know how this medicine affects you. Do not stand up or sit up quickly, especially if you are an older patient. This reduces the risk of dizzy or fainting spells.  Alcohol may interfere with the effect of this medicine. Your mouth may get dry. Chewing sugarless gum or sucking hard candy and drinking plenty of water may help. Contact your healthcare professional if the problem does not go away or is severe. If you take migraine medicines for 10 or more days a month, your migraines may get worse. Keep a diary of headache days and medicine use. Contact your healthcare professional if your migraine attacks occur more frequently. What side effects may I notice from receiving this medicine? Side effects that you should report to your doctor or health care professional as soon as possible:  allergic reactions like skin rash, itching or hives, swelling of the face, lips, or tongue  chest pain or chest tightness  signs and symptoms of a dangerous change in heartbeat or heart rhythm like chest pain; dizziness; fast, irregular heartbeat; palpitations; feeling faint or lightheaded; falls; breathing problems  signs and symptoms of a stroke like changes in vision; confusion; trouble speaking or understanding; severe headaches; sudden numbness or weakness of the face, arm or leg; trouble walking; dizziness; loss of balance or coordination  signs and symptoms of serotonin syndrome like irritable; confusion; diarrhea; fast or irregular heartbeat; muscle twitching; stiff muscles; trouble walking; sweating; high fever; seizures; chills; vomiting Side effects that usually do not require medical attention (report to your doctor or health care professional if they continue or are bothersome):  diarrhea  dizziness  drowsiness  dry mouth  headache  nausea, vomiting  pain, tingling, numbness in the hands or feet  stomach pain This list may not describe all possible side effects. Call your doctor for medical advice about side effects. You may report side effects to FDA at 1-800-FDA-1088. Where should I keep my medicine? Keep out of the reach of children. Store at room  temperature between 15 and 30 degrees C (59 and 86 degrees F). Protect from light and moisture. Throw away any unused medicine after the expiration date. NOTE: This sheet is a summary. It may not cover all possible information. If you have questions about this medicine, talk to your doctor, pharmacist, or health care provider.  2020 [employer]/Gold Standard (2018-01-26 14:58:08) Topiramate tablets What is this medicine? TOPIRAMATE (toe PYRE a mate) is used to treat seizures in adults or children with epilepsy. It is also used for the prevention of migraine headaches. This medicine may be used for other purposes; ask your health care provider or pharmacist if you have questions. COMMON BRAND NAME(S): Topamax, Topiragen What should I tell my health care provider before I take this medicine? They need to know if you have any of these conditions:  bleeding disorders  kidney disease  lung or breathing disease, like asthma  suicidal thoughts, plans, or attempt; a previous suicide attempt by you or a family member  an unusual or allergic reaction to topiramate, other medicines, foods, dyes, or preservatives  pregnant or trying  to get pregnant  breast-feeding How should I use this medicine? Take this medicine by mouth with a glass of water. Follow the directions on the prescription label. Do not cut, crush or chew this medicine. Swallow the tablets whole. You can take it with or without food. If it upsets your stomach, take it with food. Take your medicine at regular intervals. Do not take it more often than directed. Do not stop taking except on your doctor's advice. A special MedGuide will be given to you by the pharmacist with each prescription and refill. Be sure to read this information carefully each time. Talk to your pediatrician regarding the use of this medicine in children. While this drug may be prescribed for children as young as 76 years of age for selected conditions, precautions do  apply. Overdosage: If you think you have taken too much of this medicine contact a poison control center or emergency room at once. NOTE: This medicine is only for you. Do not share this medicine with others. What if I miss a dose? If you miss a dose, take it as soon as you can. If your next dose is to be taken in less than 6 hours, then do not take the missed dose. Take the next dose at your regular time. Do not take double or extra doses. What may interact with this medicine? This medicine may interact with the following medications:  acetazolamide  alcohol  antihistamines for allergy, cough, and cold  aspirin and aspirin-like medicines  atropine  birth control pills  certain medicines for anxiety or sleep  certain medicines for bladder problems like oxybutynin, tolterodine  certain medicines for depression like amitriptyline, fluoxetine, sertraline  certain medicines for seizures like carbamazepine, phenobarbital, phenytoin, primidone, valproic acid, zonisamide  certain medicines for stomach problems like dicyclomine, hyoscyamine  certain medicines for travel sickness like scopolamine  certain medicines for Parkinson's disease like benztropine, trihexyphenidyl  certain medicines that treat or prevent blood clots like warfarin, enoxaparin, dalteparin, apixaban, dabigatran, and rivaroxaban  digoxin  general anesthetics like halothane, isoflurane, methoxyflurane, propofol  hydrochlorothiazide  ipratropium  lithium  medicines that relax muscles for surgery  metformin  narcotic medicines for pain  NSAIDs, medicines for pain and inflammation, like ibuprofen or naproxen  phenothiazines like chlorpromazine, mesoridazine, prochlorperazine, thioridazine  pioglitazone This list may not describe all possible interactions. Give your health care provider a list of all the medicines, herbs, non-prescription drugs, or dietary supplements you use. Also tell them if you  smoke, drink alcohol, or use illegal drugs. Some items may interact with your medicine. What should I watch for while using this medicine? Visit your doctor or health care professional for regular checks on your progress. Tell your health care professional if your symptoms do not start to get better or if they get worse. Do not stop taking except on your health care professional's advice. You may develop a severe reaction. Your health care professional will tell you how much medicine to take. Wear a medical ID bracelet or chain. Carry a card that describes your disease and details of your medicine and dosage times. This medicine can reduce the response of your body to heat or cold. Dress warm in cold weather and stay hydrated in hot weather. If possible, avoid extreme temperatures like saunas, hot tubs, very hot or cold showers, or activities that can cause dehydration such as vigorous exercise. Check with your health care professional if you have severe diarrhea, nausea, and vomiting, or if you sweat  a lot. The loss of too much body fluid may make it dangerous for you to take this medicine. You may get drowsy or dizzy. Do not drive, use machinery, or do anything that needs mental alertness until you know how this medicine affects you. Do not stand up or sit up quickly, especially if you are an older patient. This reduces the risk of dizzy or fainting spells. Alcohol may interfere with the effect of this medicine. Avoid alcoholic drinks. Tell your health care professional right away if you have any change in your eyesight. Patients and their families should watch out for new or worsening depression or thoughts of suicide. Also watch out for sudden changes in feelings such as feeling anxious, agitated, panicky, irritable, hostile, aggressive, impulsive, severely restless, overly excited and hyperactive, or not being able to sleep. If this happens, especially at the beginning of treatment or after a change in  dose, call your healthcare professional. This medicine may cause serious skin reactions. They can happen weeks to months after starting the medicine. Contact your health care provider right away if you notice fevers or flu-like symptoms with a rash. The rash may be red or purple and then turn into blisters or peeling of the skin. Or, you might notice a red rash with swelling of the face, lips or lymph nodes in your neck or under your arms. Birth control may not work properly while you are taking this medicine. Talk to your health care professional about using an extra method of birth control. Women should inform their health care professional if they wish to become pregnant or think they might be pregnant. There is a potential for serious side effects and harm to an unborn child. Talk to your health care professional for more information. What side effects may I notice from receiving this medicine? Side effects that you should report to your doctor or health care professional as soon as possible:  allergic reactions like skin rash, itching or hives, swelling of the face, lips, or tongue  blood in the urine  changes in vision  confusion  loss of memory  pain in lower back or side  pain when urinating  redness, blistering, peeling or loosening of the skin, including inside the mouth  signs and symptoms of bleeding such as bloody or black, tarry stools; red or dark brown urine; spitting up blood or brown material that looks like coffee grounds; red spots on the skin; unusual bruising or bleeding from the eyes, gums, or nose  signs and symptoms of increased acid in the body like breathing fast; fast heartbeat; headache; confusion; unusually weak or tired; nausea, vomiting  suicidal thoughts, mood changes  trouble speaking or understanding  unusual sweating  unusually weak or tired Side effects that usually do not require medical attention (report to your doctor or health care  professional if they continue or are bothersome):  dizziness  drowsiness  fever  loss of appetite  nausea, vomiting  pain, tingling, numbness in the hands or feet  stomach pain  tiredness  upset stomach This list may not describe all possible side effects. Call your doctor for medical advice about side effects. You may report side effects to FDA at 1-800-FDA-1088. Where should I keep my medicine? Keep out of the reach of children. Store at room temperature between 15 and 30 degrees C (59 and 86 degrees F). Throw away any unused medicine after the expiration date. NOTE: This sheet is a summary. It may not cover all  possible information. If you have questions about this medicine, talk to your doctor, pharmacist, or health care provider.  2020 Elsevier/Gold Standard (2019-02-10 15:07:20) Vernell BarrierFremanezumab injection What is this medicine? FREMANEZUMAB (fre ma NEZ ue mab) is used to prevent migraine headaches. This medicine may be used for other purposes; ask your health care provider or pharmacist if you have questions. COMMON BRAND NAME(S): AJOVY What should I tell my health care provider before I take this medicine? They need to know if you have any of these conditions:  an unusual or allergic reaction to fremanezumab, other medicines, foods, dyes, or preservatives  pregnant or trying to get pregnant  breast-feeding How should I use this medicine? This medicine is for injection under the skin. You will be taught how to prepare and give this medicine. Use exactly as directed. Take your medicine at regular intervals. Do not take your medicine more often than directed. It is important that you put your used needles and syringes in a special sharps container. Do not put them in a trash can. If you do not have a sharps container, call your pharmacist or healthcare provider to get one. Talk to your pediatrician regarding the use of this medicine in children. Special care may be  needed. Overdosage: If you think you have taken too much of this medicine contact a poison control center or emergency room at once. NOTE: This medicine is only for you. Do not share this medicine with others. What if I miss a dose? If you miss a dose, take it as soon as you can. If it is almost time for your next dose, take only that dose. Do not take double or extra doses. What may interact with this medicine? Interactions are not expected. This list may not describe all possible interactions. Give your health care provider a list of all the medicines, herbs, non-prescription drugs, or dietary supplements you use. Also tell them if you smoke, drink alcohol, or use illegal drugs. Some items may interact with your medicine. What should I watch for while using this medicine? Tell your doctor or healthcare professional if your symptoms do not start to get better or if they get worse. What side effects may I notice from receiving this medicine? Side effects that you should report to your doctor or health care professional as soon as possible:  allergic reactions like skin rash, itching or hives, swelling of the face, lips, or tongue Side effects that usually do not require medical attention (report these to your doctor or health care professional if they continue or are bothersome):  pain, redness, or irritation at site where injected This list may not describe all possible side effects. Call your doctor for medical advice about side effects. You may report side effects to FDA at 1-800-FDA-1088. Where should I keep my medicine? Keep out of the reach of children. You will be instructed on how to store this medicine. Throw away any unused medicine after the expiration date on the label. NOTE: This sheet is a summary. It may not cover all possible information. If you have questions about this medicine, talk to your doctor, pharmacist, or health care provider.  2020 Elsevier/Gold Standard (2017-04-13  17:22:56)

## 2020-06-20 NOTE — Addendum Note (Signed)
Addended by: Naomie Dean B on: 06/20/2020 08:32 AM   Modules accepted: Level of Service

## 2020-10-17 ENCOUNTER — Ambulatory Visit: Payer: No Typology Code available for payment source | Admitting: Family Medicine

## 2020-11-14 ENCOUNTER — Other Ambulatory Visit: Payer: Self-pay | Admitting: Internal Medicine

## 2020-11-14 DIAGNOSIS — Z1231 Encounter for screening mammogram for malignant neoplasm of breast: Secondary | ICD-10-CM

## 2020-11-26 NOTE — Patient Instructions (Signed)
Below is our plan:  We will increase topiramate to 100mg  daily. Continue rizatriptan as needed. May consider restarting Ajovy in the future, if needed.   Please make sure you are staying well hydrated. I recommend 50-60 ounces daily. Well balanced diet and regular exercise encouraged. Consistent sleep schedule with 6-8 hours recommended.   Please continue follow up with care team as directed.   Follow up with me in 6 months   You may receive a survey regarding today's visit. I encourage you to leave honest feed back as I do use this information to improve patient care. Thank you for seeing me today!      Migraine Headache A migraine headache is a very strong throbbing pain on one side or both sides of your head. This type of headache can also cause other symptoms. It can last from 4 hours to 3 days. Talk with your doctor about what things may bring on (trigger) this condition. What are the causes? The exact cause of this condition is not known. This condition may be triggered or caused by:  Drinking alcohol.  Smoking.  Taking medicines, such as: ? Medicine used to treat chest pain (nitroglycerin). ? Birth control pills. ? Estrogen. ? Some blood pressure medicines.  Eating or drinking certain products.  Doing physical activity. Other things that may trigger a migraine headache include:  Having a menstrual period.  Pregnancy.  Hunger.  Stress.  Not getting enough sleep or getting too much sleep.  Weather changes.  Tiredness (fatigue). What increases the risk?  Being 63-67 years old.  Being female.  Having a family history of migraine headaches.  Being Caucasian.  Having depression or anxiety.  Being very overweight. What are the signs or symptoms?  A throbbing pain. This pain may: ? Happen in any area of the head, such as on one side or both sides. ? Make it hard to do daily activities. ? Get worse with physical activity. ? Get worse around bright  lights or loud noises.  Other symptoms may include: ? Feeling sick to your stomach (nauseous). ? Vomiting. ? Dizziness. ? Being sensitive to bright lights, loud noises, or smells.  Before you get a migraine headache, you may get warning signs (an aura). An aura may include: ? Seeing flashing lights or having blind spots. ? Seeing bright spots, halos, or zigzag lines. ? Having tunnel vision or blurred vision. ? Having numbness or a tingling feeling. ? Having trouble talking. ? Having weak muscles.  Some people have symptoms after a migraine headache (postdromal phase), such as: ? Tiredness. ? Trouble thinking (concentrating). How is this treated?  Taking medicines that: ? Relieve pain. ? Relieve the feeling of being sick to your stomach. ? Prevent migraine headaches.  Treatment may also include: ? Having acupuncture. ? Avoiding foods that bring on migraine headaches. ? Learning ways to control your body functions (biofeedback). ? Therapy to help you know and deal with negative thoughts (cognitive behavioral therapy). Follow these instructions at home: Medicines  Take over-the-counter and prescription medicines only as told by your doctor.  Ask your doctor if the medicine prescribed to you: ? Requires you to avoid driving or using heavy machinery. ? Can cause trouble pooping (constipation). You may need to take these steps to prevent or treat trouble pooping:  Drink enough fluid to keep your pee (urine) pale yellow.  Take over-the-counter or prescription medicines.  Eat foods that are high in fiber. These include beans, whole grains, and fresh fruits  and vegetables.  Limit foods that are high in fat and sugar. These include fried or sweet foods. Lifestyle  Do not drink alcohol.  Do not use any products that contain nicotine or tobacco, such as cigarettes, e-cigarettes, and chewing tobacco. If you need help quitting, ask your doctor.  Get at least 8 hours of sleep  every night.  Limit and deal with stress. General instructions  Keep a journal to find out what may bring on your migraine headaches. For example, write down: ? What you eat and drink. ? How much sleep you get. ? Any change in what you eat or drink. ? Any change in your medicines.  If you have a migraine headache: ? Avoid things that make your symptoms worse, such as bright lights. ? It may help to lie down in a dark, quiet room. ? Do not drive or use heavy machinery. ? Ask your doctor what activities are safe for you.  Keep all follow-up visits as told by your doctor. This is important.      Contact a doctor if:  You get a migraine headache that is different or worse than others you have had.  You have more than 15 headache days in one month. Get help right away if:  Your migraine headache gets very bad.  Your migraine headache lasts longer than 72 hours.  You have a fever.  You have a stiff neck.  You have trouble seeing.  Your muscles feel weak or like you cannot control them.  You start to lose your balance a lot.  You start to have trouble walking.  You pass out (faint).  You have a seizure. Summary  A migraine headache is a very strong throbbing pain on one side or both sides of your head. These headaches can also cause other symptoms.  This condition may be treated with medicines and changes to your lifestyle.  Keep a journal to find out what may bring on your migraine headaches.  Contact a doctor if you get a migraine headache that is different or worse than others you have had.  Contact your doctor if you have more than 15 headache days in a month. This information is not intended to replace advice given to you by your health care provider. Make sure you discuss any questions you have with your health care provider. Document Revised: 11/05/2018 Document Reviewed: 08/26/2018 Elsevier Patient Education  2021 [employer].

## 2020-11-26 NOTE — Progress Notes (Signed)
Chief Complaint  Patient presents with  . Follow-up    RM 1 Alone  Pt is well, headaches has increased slightly, has about 4 a month     HISTORY OF PRESENT ILLNESS: 11/27/20 ALL:  Lorraine West is a 40 y.o. female here today for follow up for migraines. She was started on topiramate 100mg  daily and given Ajovy (675mg ) at last visit with Dr 05/2020. She did not continue Ajovy injections and has continued topiramate 50mg  instead of 100mg . Rizatriptan was prescribed for abortive therapy. She feels that she is doing fairly well. She is having about 4-5 migraines per month. They are less intense. Rizatriptan works well. She is tolerating medications.   HISTORY (copied from Dr Lucia Gaskins previous note)  HPI11/23/2021: Here for new equest of headaches. Worsening. Headaches now daily. PMHx migraines since 2011. MRI of the brain was normal (reviewed images) from 05/25/2018: When we saw her last she was worried about her cousin who had a brain tumor and wanted an MRI which was normal(personally reviewed images). Today back to discuss migraine management. Migraines start on the right side and travels behinds the eyes but can start on the left behind the eye as well, photophobia/phonoohobia,nausea but no vomiting, can last 24-72 hours untreated and recently she feels they are continuous. Daily headaches, at least 15 migraine days a month that are moderately severe to severe, OTC like excedrin do not help, a dark room may help. No aura. No other focal neurologic deficits, associated symptoms, inciting events or modifiable factors.  Cbc with slight anemia, cmp unremarkable   HPI 04/2018:  Lorraine West is a 40 y.o. female here as requested by Dr. 05/27/2018 for headaches.  PMHx migraines started in 2011. Mother has migraines. In 2013 she had a migraine for a month, a steroid dosepak helped. Headaches have been on and off. Recently worsening and daily. Cousin died from a brain tumor. She has  20 headache days a month. Migraines start on the right more posterior then travels to behind the eyes, pulsating, movement makes it worse, +light sensitiviy, a dark room helps, +nausea but no vomiting. 10 migraine days a month. Ongoing for 4-6 months at this frequency. Migraines can be moderate to severe. She has woken up with dull headache and worsenes as the day progresses. A migraine can last 1-3 days.  headaches can be positional and worse with bearing down. + blurry vision changes.FHx cancer, mother and grandparents died from cancers and a 1st cousin died of a malignant brain tumor.   meds tried: propranol, tizanidine, tylenol, celebrex, benadryl, ibuprofen, zofran,tizanidine    REVIEW OF SYSTEMS: Out of a complete 14 system review of symptoms, the patient complains only of the following symptoms, headaches and all other reviewed systems are negative.    ALLERGIES: Allergies  Allergen Reactions  . Gluten Meal Nausea Only    Scratchy throat      HOME MEDICATIONS: Outpatient Medications Prior to Visit  Medication Sig Dispense Refill  . iron polysaccharides (NIFEREX) 150 MG capsule Take 1 capsule (150 mg total) by mouth 2 (two) times daily. 60 capsule 3  . ondansetron (ZOFRAN-ODT) 4 MG disintegrating tablet TAKE 1 TABLET BY MOUTH EVERY 8 HOURS AS NEEDED FOR NAUSEA. (Patient taking differently: Take by mouth every 8 (eight) hours as needed. for nausea) 30 tablet 3  . PRESCRIPTION MEDICATION IUD    . rizatriptan (MAXALT-MLT) 10 MG disintegrating tablet TAKE 1 TABLET BY MOUTH DAILY AS NEEDED FOR MIGRAINE. MAY REPEAT IN 2  HOURS IF NEEDED 9 tablet 11  . topiramate (TOPAMAX) 50 MG tablet TAKE 2 TABLETS BY MOUTH AT BEDTIME. 60 tablet 6  . Fremanezumab-vfrm SOSY 675 mg      No facility-administered medications prior to visit.     PAST MEDICAL HISTORY: Past Medical History:  Diagnosis Date  . Alpha thalassemia (HCC)   . Asthma   . Heart murmur    MVP  . Migraine      PAST  SURGICAL HISTORY: Past Surgical History:  Procedure Laterality Date  . NO PAST SURGERIES       FAMILY HISTORY: Family History  Problem Relation Age of Onset  . Breast cancer Mother 61       d. 53y  . Migraines Mother   . Diabetes Father   . Colon cancer Maternal Grandmother        dx [phone]  . Heart disease Maternal Grandfather   . Heart Problems Maternal Grandfather   . Cancer Maternal Grandfather        NOS cancer  . Kidney cancer Paternal Grandmother        dx unspecified age  . Lung cancer Paternal Grandfather        dx unspecified age  . Cervical cancer Maternal Aunt        d. 62y  . Brain cancer Cousin 29       maternal 1st cousin d. 33y; NOS brain tumor  . Breast cancer Cousin 73       maternal 1st cousin  . Prostate cancer Cousin 25       maternal 1st cousin d. 25y     SOCIAL HISTORY: Social History   Socioeconomic History  . Marital status: Married    Spouse name: Not on file  . Number of children: 3  . Years of education: 72  . Highest education level: Master's degree (e.g., MA, MS, MEng, MEd, MSW, MBA)  Occupational History  . Not on file  Tobacco Use  . Smoking status: Never Smoker  . Smokeless tobacco: Never Used  Vaping Use  . Vaping Use: Never used  Substance and Sexual Activity  . Alcohol use: No  . Drug use: Never  . Sexual activity: Not on file  Other Topics Concern  . 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   Right handed   Lives at home with her children    Currently separated, not legally   Caffeine: 1 cup of coffee/tea daily   Social Determinants of Health   Financial Resource Strain: Not on file  Food Insecurity: Not on file  Transportation Needs: Not on file  Physical Activity: Not on file  Stress: Not on file  Social Connections: Not on file  Intimate Partner Violence: Not on file      PHYSICAL EXAM  Vitals:   11/27/20 0756  BP: 101/68  Pulse: 91  Weight: 126 lb (57.2 kg)  Height: 4\' 11"   (1.499 m)   Body mass index is 25.45 kg/m.   Generalized: Well developed, in no acute distress  Cardiology: normal rate and rhythm, no murmur auscultated  Respiratory: clear to auscultation bilaterally    Neurological examination  Mentation: Alert oriented to time, place, history taking. Follows all commands speech and language fluent Cranial nerve II-XII: Pupils were equal round reactive to light. Extraocular movements were full, visual field were full on confrontational test. Facial sensation and strength were normal. Head turning and shoulder shrug  were normal and symmetric. Motor:  The motor testing reveals 5 over 5 strength of all 4 extremities. Good symmetric motor tone is noted throughout.  Gait and station: Gait is normal.      DIAGNOSTIC DATA (LABS, IMAGING, TESTING) - I reviewed patient records, labs, notes, testing and imaging myself where available.  Lab Results  Component Value Date   WBC 8.2 10/12/2019   HGB 11.2 (L) 10/12/2019   HCT 36.8 10/12/2019   MCV 69.1 (L) 10/12/2019   PLT 339 10/12/2019      Component Value Date/Time   NA 141 10/12/2019 1352   K 4.1 10/12/2019 1352   CL 104 10/12/2019 1352   CO2 27 10/12/2019 1352   GLUCOSE 81 10/12/2019 1352   BUN 10 10/12/2019 1352   CREATININE 0.69 10/12/2019 1352   CALCIUM 9.0 10/12/2019 1352   PROT 7.7 10/12/2019 1352   ALBUMIN 3.7 10/12/2019 1352   AST 14 (L) 10/12/2019 1352   ALT <6 10/12/2019 1352   ALKPHOS 88 10/12/2019 1352   BILITOT 0.3 10/12/2019 1352   GFRNONAA >60 10/12/2019 1352   GFRAA >60 10/12/2019 1352   No results found for: CHOL, HDL, LDLCALC, LDLDIRECT, TRIG, CHOLHDL No results found for: LTJQ3E Lab Results  Component Value Date   VITAMINB12 242 10/12/2019   No results found for: TSH  No flowsheet data found.   No flowsheet data found.   ASSESSMENT AND PLAN  40 y.o. year old female  has a past medical history of Alpha thalassemia (HCC), Asthma, Heart murmur, and Migraine.  here with     Chronic migraine without aura without status migrainosus, not intractable  Lorraine West reports significant improvement in migraine intensity and frequency after starting topiramate. She is hesitant to continue Ajovy injections and has tolerated topiramate. We will increase topiramate to 100mg  daily. She will continue rizatriptan and ondansetron as needed. Common triggers discussed. She will continue healthy lifestyle habits. She will follow up with me in 6 months, sooner if needed. She verbalizes understanding and agreement with this plan.    No orders of the defined types were placed in this encounter.    Meds ordered this encounter  Medications  . topiramate (TOPAMAX) 100 MG tablet    Sig: Take 1 tablet (100 mg total) by mouth 2 (two) times daily.    Dispense:  180 tablet    Refill:  3    Order Specific Question:   Supervising Provider    Answer:   Anson Fret  . rizatriptan (MAXALT-MLT) 10 MG disintegrating tablet    Sig: TAKE 1 TABLET BY MOUTH DAILY AS NEEDED FOR MIGRAINE. MAY REPEAT IN 2 HOURS IF NEEDED    Dispense:  9 tablet    Refill:  11    Order Specific Question:   Supervising Provider    Answer:   [id number] Anson Fret      [[id number]], MSN, FNP-C 11/27/2020, 8:25 AM  [facility] [address], [address] [place], [place] [phone]

## 2020-11-27 ENCOUNTER — Encounter: Payer: Self-pay | Admitting: Family Medicine

## 2020-11-27 ENCOUNTER — Other Ambulatory Visit (HOSPITAL_COMMUNITY): Payer: Self-pay

## 2020-11-27 ENCOUNTER — Ambulatory Visit: Payer: No Typology Code available for payment source | Admitting: Family Medicine

## 2020-11-27 VITALS — BP 101/68 | HR 91 | Ht 59.0 in | Wt 126.0 lb

## 2020-11-27 DIAGNOSIS — G43709 Chronic migraine without aura, not intractable, without status migrainosus: Secondary | ICD-10-CM

## 2020-11-27 MED ORDER — RIZATRIPTAN BENZOATE 10 MG PO TBDP
10.0000 mg | ORAL_TABLET | Freq: Every day | ORAL | 11 refills | Status: DC | PRN
  Filled 2020-11-27: qty 9, 30d supply, fill #0

## 2020-11-27 MED ORDER — TOPIRAMATE 100 MG PO TABS
100.0000 mg | ORAL_TABLET | Freq: Two times a day (BID) | ORAL | 3 refills | Status: DC
  Filled 2020-11-27: qty 180, 90d supply, fill #0

## 2021-01-10 ENCOUNTER — Ambulatory Visit: Payer: No Typology Code available for payment source

## 2021-02-06 ENCOUNTER — Ambulatory Visit
Admission: RE | Admit: 2021-02-06 | Discharge: 2021-02-06 | Disposition: A | Discharge status: Home / Self Care | Payer: No Typology Code available for payment source | Source: Ambulatory Visit | Attending: Internal Medicine | Admitting: Internal Medicine

## 2021-02-06 ENCOUNTER — Other Ambulatory Visit: Payer: Self-pay

## 2021-02-06 DIAGNOSIS — Z1231 Encounter for screening mammogram for malignant neoplasm of breast: Secondary | ICD-10-CM

## 2021-02-26 ENCOUNTER — Other Ambulatory Visit (HOSPITAL_COMMUNITY): Payer: Self-pay

## 2021-02-26 MED ORDER — PANTOPRAZOLE SODIUM 40 MG PO TBEC
40.0000 mg | DELAYED_RELEASE_TABLET | Freq: Every day | ORAL | 3 refills | Status: AC
  Filled 2021-02-26: qty 30, 30d supply, fill #0

## 2021-06-03 ENCOUNTER — Ambulatory Visit: Payer: No Typology Code available for payment source | Admitting: Family Medicine

## 2022-03-20 ENCOUNTER — Other Ambulatory Visit (HOSPITAL_COMMUNITY): Payer: Self-pay

## 2022-05-13 ENCOUNTER — Other Ambulatory Visit: Payer: Self-pay | Admitting: Internal Medicine

## 2022-05-13 DIAGNOSIS — Z1231 Encounter for screening mammogram for malignant neoplasm of breast: Secondary | ICD-10-CM

## 2022-05-15 ENCOUNTER — Ambulatory Visit
Admission: RE | Admit: 2022-05-15 | Discharge: 2022-05-15 | Disposition: A | Discharge status: Home / Self Care | Payer: Commercial Managed Care - PPO | Source: Ambulatory Visit | Attending: Internal Medicine | Admitting: Internal Medicine

## 2022-05-15 DIAGNOSIS — Z1231 Encounter for screening mammogram for malignant neoplasm of breast: Secondary | ICD-10-CM

## 2022-05-19 ENCOUNTER — Other Ambulatory Visit: Payer: Self-pay | Admitting: Internal Medicine

## 2022-05-19 DIAGNOSIS — R928 Other abnormal and inconclusive findings on diagnostic imaging of breast: Secondary | ICD-10-CM

## 2022-06-04 ENCOUNTER — Ambulatory Visit
Admission: RE | Admit: 2022-06-04 | Discharge: 2022-06-04 | Disposition: A | Discharge status: Home / Self Care | Payer: Commercial Managed Care - PPO | Source: Ambulatory Visit | Attending: Internal Medicine | Admitting: Internal Medicine

## 2022-06-04 ENCOUNTER — Other Ambulatory Visit: Payer: Self-pay | Admitting: Internal Medicine

## 2022-06-04 DIAGNOSIS — R928 Other abnormal and inconclusive findings on diagnostic imaging of breast: Secondary | ICD-10-CM

## 2022-06-04 DIAGNOSIS — N6489 Other specified disorders of breast: Secondary | ICD-10-CM

## 2022-06-04 HISTORY — DX: Genetic susceptibility to malignant neoplasm of ovary: Z15.02

## 2022-06-04 HISTORY — DX: Encounter for nonprocreative screening for genetic disease carrier status: Z13.71

## 2022-06-04 HISTORY — DX: Genetic susceptibility to malignant neoplasm of breast: Z15.01

## 2022-06-10 ENCOUNTER — Ambulatory Visit
Admission: RE | Admit: 2022-06-10 | Discharge: 2022-06-10 | Disposition: A | Discharge status: Home / Self Care | Payer: Commercial Managed Care - PPO | Source: Ambulatory Visit | Attending: Internal Medicine | Admitting: Internal Medicine

## 2022-06-10 DIAGNOSIS — N6489 Other specified disorders of breast: Secondary | ICD-10-CM

## 2022-06-10 HISTORY — PX: BREAST BIOPSY: SHX20

## 2022-06-25 ENCOUNTER — Other Ambulatory Visit: Payer: Self-pay | Admitting: Nurse Practitioner

## 2022-06-25 DIAGNOSIS — Z9189 Other specified personal risk factors, not elsewhere classified: Secondary | ICD-10-CM

## 2022-06-30 ENCOUNTER — Other Ambulatory Visit: Payer: Commercial Managed Care - PPO

## 2022-10-18 ENCOUNTER — Ambulatory Visit
Admission: EM | Admit: 2022-10-18 | Discharge: 2022-10-18 | Disposition: A | Discharge status: Home / Self Care | Payer: Commercial Managed Care - PPO | Attending: Internal Medicine | Admitting: Internal Medicine

## 2022-10-18 DIAGNOSIS — Z20818 Contact with and (suspected) exposure to other bacterial communicable diseases: Secondary | ICD-10-CM | POA: Diagnosis present

## 2022-10-18 DIAGNOSIS — J029 Acute pharyngitis, unspecified: Secondary | ICD-10-CM

## 2022-10-18 DIAGNOSIS — J069 Acute upper respiratory infection, unspecified: Secondary | ICD-10-CM | POA: Diagnosis not present

## 2022-10-18 LAB — POCT RAPID STREP A (OFFICE): Rapid Strep A Screen: NEGATIVE

## 2022-10-18 MED ORDER — AMOXICILLIN 500 MG PO CAPS: 500.0000 mg | ORAL_CAPSULE | Freq: Two times a day (BID) | ORAL | 0 refills | Status: AC

## 2022-10-18 NOTE — ED Provider Notes (Signed)
[facility]    CSN: [id number] Arrival date & time: 10/18/22  1344      History   Chief Complaint Chief Complaint  Patient presents with   Sore Throat    HPI Lorraine West is a 42 y.o. female.   Patient presents with nasal congestion and sore throat that started about 5 to 6 days ago.  Reports her son tested positive for strep throat today.  She denies any known fevers at home.  Denies cough, chest pain, shortness of breath.  Patient does not report any medications for symptoms.  Denies history of asthma.   Sore Throat    Past Medical History:  Diagnosis Date   Alpha thalassemia ([place])    Asthma    BRCA gene mutation negative in female    second time- she was tested negative when she was 42 years old   BRCA gene mutation positive in female    1st testing was positive when she was 42 years old   Heart murmur    MVP   Migraine     Patient Active Problem List   Diagnosis Date Noted   Chronic migraine without aura without status migrainosus, not intractable 05/19/2018   Genetic testing 07/14/2016   Family history of breast cancer in female 06/18/2016   Vaginal delivery 03/08/2016   Second-degree perineal laceration, with delivery 03/08/2016   Alpha thalassemia ([place]) 03/08/2016    Past Surgical History:  Procedure Laterality Date   BREAST BIOPSY Right 06/10/2022   [place] RT BREAST BX W LOC DEV 1ST LESION IMG BX SPEC US GUIDE 06/10/2022 [facility]   NO PAST SURGERIES      OB History     Gravida  3   Para  3   Term  3   Preterm  0   AB  0   Living  3      SAB  0   IAB  0   Ectopic  0   Multiple  0   Live Births  3            Home Medications    Prior to Admission medications   Medication Sig Start Date End Date Taking? Authorizing Provider  amoxicillin (AMOXIL) 500 MG capsule Take 1 capsule (500 mg total) by mouth 2 (two) times daily for 10 days. 10/18/22 10/28/22 Yes , Michele Rockers, FNP  iron polysaccharides  (NIFEREX) 150 MG capsule Take 1 capsule (150 mg total) by mouth 2 (two) times daily. 10/12/19   Brunetta Genera, MD  pantoprazole (PROTONIX) 40 MG tablet Take 1 tablet (40 mg total) by mouth daily. 02/26/21     PRESCRIPTION MEDICATION IUD    [provider]  rizatriptan (MAXALT-MLT) 10 MG disintegrating tablet DISSOLVE 1 TABLET BY MOUTH DAILY AS NEEDED FOR MIGRAINE. MAY REPEAT IN 2 HOURS IF NEEDED 11/27/20   Lomax, Amy, NP  topiramate (TOPAMAX) 100 MG tablet Take 1 tablet (100 mg total) by mouth 2 (two) times daily. 11/27/20   Debbora Presto, NP    Family History Family History  Problem Relation Age of Onset   Breast cancer Mother 11       d. 59y   Migraines Mother    Diabetes Father    Cervical cancer Maternal Aunt        d. 62y   Colon cancer Maternal Grandmother        dx 82-83y   Heart disease Maternal Grandfather    Heart Problems Maternal Grandfather  Cancer Maternal Grandfather        NOS cancer   Kidney cancer Paternal Grandmother        dx unspecified age   Lung cancer Paternal Grandfather        dx unspecified age   Brain cancer Cousin 46       maternal 1st cousin d. 32y; NOS brain tumor   Breast cancer Cousin 21       maternal 1st cousin   Prostate cancer Cousin 53       maternal 1st cousin d. 25y   Breast cancer Cousin 32    Social History Social History   Tobacco Use   Smoking status: Never   Smokeless tobacco: Never  Vaping Use   Vaping Use: Never used  Substance Use Topics   Alcohol use: No   Drug use: Never     Allergies   Gluten meal   Review of Systems Review of Systems Per HPI  Physical Exam Triage Vital Signs ED Triage Vitals [10/18/22 1421]  Enc Vitals Group     BP (!) 149/75     Pulse Rate 92     Resp 16     Temp 98.4 F (36.9 C)     Temp Source Oral     SpO2 96 %     Weight      Height      Head Circumference      Peak Flow      Pain Score 5     Pain Loc      Pain Edu?      Excl. in [place]?    No data  found.  Updated Vital Signs BP (!) 149/75 (BP Location: Left Arm)   Pulse 92   Temp 98.4 F (36.9 C) (Oral)   Resp 16   SpO2 96%   Visual Acuity Right Eye Distance:   Left Eye Distance:   Bilateral Distance:    Right Eye Near:   Left Eye Near:    Bilateral Near:     Physical Exam Constitutional:      General: She is not in acute distress.    Appearance: Normal appearance. She is not toxic-appearing or diaphoretic.  HENT:     Head: Normocephalic and atraumatic.     Right Ear: Tympanic membrane and ear canal normal.     Left Ear: Tympanic membrane and ear canal normal.     Nose: Congestion present.     Mouth/Throat:     Mouth: Mucous membranes are moist.     Pharynx: Posterior oropharyngeal erythema present.  Eyes:     Extraocular Movements: Extraocular movements intact.     Conjunctiva/sclera: Conjunctivae normal.     Pupils: Pupils are equal, round, and reactive to light.  Cardiovascular:     Rate and Rhythm: Normal rate and regular rhythm.     Pulses: Normal pulses.     Heart sounds: Normal heart sounds.  Pulmonary:     Effort: Pulmonary effort is normal. No respiratory distress.     Breath sounds: Normal breath sounds. No wheezing.  Abdominal:     General: Abdomen is flat. Bowel sounds are normal.     Palpations: Abdomen is soft.  Musculoskeletal:        General: Normal range of motion.     Cervical back: Normal range of motion.  Skin:    General: Skin is warm and dry.  Neurological:     General: No focal deficit present.  Mental Status: She is alert and oriented to person, place, and time. Mental status is at baseline.  Psychiatric:        Mood and Affect: Mood normal.        Behavior: Behavior normal.      UC Treatments / Results  Labs (all labs ordered are listed, but only abnormal results are displayed) Labs Reviewed  CULTURE, GROUP A STREP [facility])  POCT RAPID STREP A (OFFICE)    EKG   Radiology No results  found.  Procedures Procedures (including critical care time)  Medications Ordered in UC Medications - No data to display  Initial Impression / Assessment and Plan / UC Course  I have reviewed the triage vital signs and the nursing notes.  Pertinent labs & imaging results that were available during my care of the patient were reviewed by me and considered in my medical decision making (see chart for details).     Rapid strep is negative but I am still suspicious of strep throat given appearance of posterior pharynx on exam and patient's close exposure.  Will opt to treat amoxicillin antibiotic today.  Discussed with patient the possibility that viral illness could be etiology of symptoms as opposed to strep throat given strep test was negative as well.  She voiced understanding of this.  Advised supportive care and symptom management.  No signs of peritonsillar abscess on exam.  Advised strict return precautions.  Patient verbalized understanding and was agreeable with plan. Final Clinical Impressions(s) / UC Diagnoses   Final diagnoses:  Acute upper respiratory infection  Sore throat  Exposure to strep throat     Discharge Instructions      Strep is negative but I am still treating you with an antibiotic for strep exposure.  Throat culture is pending.  Will call if it is abnormal.  Follow-up if any symptoms persist or worsen.     ED Prescriptions     Medication Sig Dispense Auth. Provider   amoxicillin (AMOXIL) 500 MG capsule Take 1 capsule (500 mg total) by mouth 2 (two) times daily for 10 days. 20 capsule Teodora Medici, [place]      PDMP not reviewed this encounter.   Teodora Medici, [place] 10/18/22 [phone]

## 2022-10-18 NOTE — ED Triage Notes (Signed)
Pt c/o sore throat, nasal congestion   Onset ~ Monday

## 2022-10-18 NOTE — Discharge Instructions (Signed)
Strep is negative but I am still treating you with an antibiotic for strep exposure.  Throat culture is pending.  Will call if it is abnormal.  Follow-up if any symptoms persist or worsen.

## 2022-10-19 ENCOUNTER — Ambulatory Visit: Payer: Commercial Managed Care - PPO

## 2022-10-20 LAB — CULTURE, GROUP A STREP (THRC)

## 2022-11-11 ENCOUNTER — Other Ambulatory Visit: Payer: Self-pay

## 2022-11-11 ENCOUNTER — Encounter (HOSPITAL_COMMUNITY): Payer: Self-pay

## 2022-11-11 ENCOUNTER — Emergency Department (HOSPITAL_COMMUNITY): Payer: Commercial Managed Care - PPO

## 2022-11-11 ENCOUNTER — Emergency Department (HOSPITAL_COMMUNITY)
Admission: EM | Admit: 2022-11-11 | Discharge: 2022-11-11 | Disposition: A | Discharge status: Home / Self Care | Payer: Commercial Managed Care - PPO | Attending: Emergency Medicine | Admitting: Emergency Medicine

## 2022-11-11 DIAGNOSIS — R000 Tachycardia, unspecified: Secondary | ICD-10-CM | POA: Diagnosis not present

## 2022-11-11 DIAGNOSIS — R202 Paresthesia of skin: Secondary | ICD-10-CM | POA: Diagnosis not present

## 2022-11-11 DIAGNOSIS — J45909 Unspecified asthma, uncomplicated: Secondary | ICD-10-CM | POA: Diagnosis not present

## 2022-11-11 LAB — CBC WITH DIFFERENTIAL/PLATELET
Abs Immature Granulocytes: 0.02 10*3/uL (ref 0.00–0.07)
Basophils Absolute: 0 10*3/uL (ref 0.0–0.1)
Basophils Relative: 0 %
Eosinophils Absolute: 0.1 10*3/uL (ref 0.0–0.5)
Eosinophils Relative: 1 %
HCT: 37.6 % (ref 36.0–46.0)
Hemoglobin: 12.1 g/dL (ref 12.0–15.0)
Immature Granulocytes: 0 %
Lymphocytes Relative: 20 %
Lymphs Abs: 1.8 10*3/uL (ref 0.7–4.0)
MCH: 22.4 pg — ABNORMAL LOW (ref 26.0–34.0)
MCHC: 32.2 g/dL (ref 30.0–36.0)
MCV: 69.8 fL — ABNORMAL LOW (ref 80.0–100.0)
Monocytes Absolute: 0.3 10*3/uL (ref 0.1–1.0)
Monocytes Relative: 3 %
Neutro Abs: 6.8 10*3/uL (ref 1.7–7.7)
Neutrophils Relative %: 76 %
Platelets: 265 10*3/uL (ref 150–400)
RBC: 5.39 MIL/uL — ABNORMAL HIGH (ref 3.87–5.11)
RDW: 14.6 % (ref 11.5–15.5)
WBC: 9.1 10*3/uL (ref 4.0–10.5)
nRBC: 0 % (ref 0.0–0.2)

## 2022-11-11 LAB — BASIC METABOLIC PANEL
Anion gap: 12 (ref 5–15)
BUN: 5 mg/dL — ABNORMAL LOW (ref 6–20)
CO2: 22 mmol/L (ref 22–32)
Calcium: 8.8 mg/dL — ABNORMAL LOW (ref 8.9–10.3)
Chloride: 101 mmol/L (ref 98–111)
Creatinine, Ser: 0.79 mg/dL (ref 0.44–1.00)
GFR, Estimated: >60 mL/min (ref >60)
Glucose, Bld: 115 mg/dL — ABNORMAL HIGH (ref 70–99)
Potassium: 3.5 mmol/L (ref 3.5–5.1)
Sodium: 135 mmol/L (ref 135–145)

## 2022-11-11 LAB — PREGNANCY, URINE: Preg Test, Ur: NEGATIVE

## 2022-11-11 LAB — URINALYSIS, ROUTINE W REFLEX MICROSCOPIC
Bilirubin Urine: NEGATIVE
Glucose, UA: NEGATIVE mg/dL
Hgb urine dipstick: NEGATIVE
Ketones, ur: 20 mg/dL — AB
Leukocytes,Ua: NEGATIVE
Nitrite: NEGATIVE
Protein, ur: NEGATIVE mg/dL
Specific Gravity, Urine: 1.01 (ref 1.005–1.030)
pH: 5 (ref 5.0–8.0)

## 2022-11-11 LAB — RAPID URINE DRUG SCREEN, HOSP PERFORMED
Amphetamines: NOT DETECTED
Barbiturates: NOT DETECTED
Benzodiazepines: NOT DETECTED
Cocaine: NOT DETECTED
Opiates: NOT DETECTED
Tetrahydrocannabinol: NOT DETECTED

## 2022-11-11 LAB — ETHANOL: Alcohol, Ethyl (B): <10 mg/dL (ref <10)

## 2022-11-11 LAB — PROTIME-INR
INR: 1.1 (ref 0.8–1.2)
Prothrombin Time: 14.2 seconds (ref 11.4–15.2)

## 2022-11-11 MED ORDER — GADOBUTROL 1 MMOL/ML IV SOLN
5.0000 mL | Freq: Once | INTRAVENOUS | Status: AC | PRN
  Administered 2022-11-11: 5 mL via INTRAVENOUS

## 2022-11-11 NOTE — ED Notes (Signed)
Patient transported to MRI 

## 2022-11-11 NOTE — ED Provider Triage Note (Signed)
Emergency Medicine Provider Triage Evaluation Note  Delanda Bulluck , a 42 y.o. female  was evaluated in triage.  Pt complains of paresthesias on the left side, started 6 PM yesterday, please that she has some paresthesias in her left face left arm and left leg, no associated weakness, no associate headache change in vision, no slurred speech, she has no history of strokes, no medical history of hypertension, hyperlipidemia, does not smoke, does have history of anxiety..  Review of Systems  Positive: Paresthesias arm paresthesias legs Negative: Change in vision, headaches  Physical Exam  BP 121/74   Pulse (!) 109   Temp 98 F (36.7 C)   Resp 16   Ht  (1.499 m)   Wt 59 kg   SpO2 97%   BMI 26.26 kg/m  Gen:   Awake, no distress   Resp:  Normal effort  MSK:   Moves extremities without difficulty  Other:  Cranial nerves II through XII grossly intact no difficulty with word finding following two-step commands there is no unilateral weakness present.  Gait intact, finger-nose intact, she has subjective decrease in sensation on the right side in the upper and lower extremities compared to the left  Medical Decision Making  Medically screening exam initiated at 5:18 AM.  Appropriate orders placed.  Gena Laski was informed that the remainder of the evaluation will be completed by another provider, this initial triage assessment does not replace that evaluation, and the importance of remaining in the ED until their evaluation is complete.  Patient is outside stroke window, does not meet LVO criteria, will not activate code stroke, will send down for CT head and further evaluation.   Carroll Sage, PA-C 11/11/22 [phone]

## 2022-11-11 NOTE — ED Notes (Signed)
Pt in MRI, not in room

## 2022-11-11 NOTE — ED Provider Notes (Signed)
[place] [facility] AT [facility] Provider Note   CSN: [id number] Arrival date & time: 11/11/22  0459     History  Chief Complaint  Patient presents with   Numbness    Right sided numbness    Lorraine West is a 42 y.o. female.  HPI Patient presents with change in sensation on her right side.  Began last night.  Has had a history of migraines and states she had one a couple days ago but none currently or last night.  States the arm and leg work okay but the arm leg and face both feel strange.  Has not had complicated migraine in the past.  Does have some anxiety and was recently was prescribed medicine but has not had it filled yet.  No chest pain.  No trouble breathing.   Past Medical History:  Diagnosis Date   Alpha thalassemia    Asthma    BRCA gene mutation negative in female    second time- she was tested negative when she was 42 years old   BRCA gene mutation positive in female    1st testing was positive when she was 42 years old   Heart murmur    MVP   Migraine     Home Medications Prior to Admission medications   Medication Sig Start Date End Date Taking? Authorizing Provider  iron polysaccharides (NIFEREX) 150 MG capsule Take 1 capsule (150 mg total) by mouth 2 (two) times daily. 10/12/19   Johney Maine, MD  pantoprazole (PROTONIX) 40 MG tablet Take 1 tablet (40 mg total) by mouth daily. 02/26/21     PRESCRIPTION MEDICATION IUD    [provider]  rizatriptan (MAXALT-MLT) 10 MG disintegrating tablet DISSOLVE 1 TABLET BY MOUTH DAILY AS NEEDED FOR MIGRAINE. MAY REPEAT IN 2 HOURS IF NEEDED 11/27/20   Lomax, Amy, NP  topiramate (TOPAMAX) 100 MG tablet Take 1 tablet (100 mg total) by mouth 2 (two) times daily. 11/27/20   Lomax, Amy, NP      Allergies    Gluten meal    Review of Systems   Review of Systems  Physical Exam Updated Vital Signs BP 114/68   Pulse 88   Temp 98.6 F (37 C)   Resp 20   Ht  (1.499 m)   Wt 59  kg   SpO2 100%   BMI 26.26 kg/m  Physical Exam Vitals and nursing note reviewed.  HENT:     Head: Normocephalic.  Eyes:     Extraocular Movements: Extraocular movements intact.  Cardiovascular:     Rate and Rhythm: Tachycardia present.  Pulmonary:     Breath sounds: No wheezing or rhonchi.  Neurological:     Mental Status: She is alert.     Comments: Paresthesias to right face right upper and lower extremities.  Right chest.  Strength intact.  Sensation intact but states he just feels a little different.     ED Results / Procedures / Treatments   Labs (all labs ordered are listed, but only abnormal results are displayed) Labs Reviewed  BASIC METABOLIC PANEL - Abnormal; Notable for the following components:      Result Value   Glucose, Bld 115 (*)    BUN 5 (*)    Calcium 8.8 (*)    All other components within normal limits  CBC WITH DIFFERENTIAL/PLATELET - Abnormal; Notable for the following components:   RBC 5.39 (*)    MCV 69.8 (*)    [facility]  22.4 (*)    All other components within normal limits  URINALYSIS, ROUTINE W REFLEX MICROSCOPIC - Abnormal; Notable for the following components:   Ketones, ur 20 (*)    All other components within normal limits  PROTIME-INR  ETHANOL  PREGNANCY, URINE  RAPID URINE DRUG SCREEN, HOSP PERFORMED  CBG MONITORING, ED    EKG None  Radiology MR Brain W and Wo Contrast  Result Date: 11/11/2022 CLINICAL DATA:  42 year old female with left side paresthesias since 1800 hours yesterday. Face, arm and leg involvement. EXAM: MRI HEAD WITHOUT AND WITH CONTRAST TECHNIQUE: Multiplanar, multiecho pulse sequences of the brain and surrounding structures were obtained without and with intravenous contrast. CONTRAST:  5mL GADAVIST GADOBUTROL 1 MMOL/ML IV SOLN COMPARISON:  Head CT 0655 hours today.  Prior brain MRI 05/25/2018. FINDINGS: Brain: No restricted diffusion to suggest acute infarction. No midline shift, mass effect, evidence of mass lesion,  ventriculomegaly, extra-axial collection or acute intracranial hemorrhage. Cervicomedullary junction and pituitary are within normal limits. Wallace Cullens and white matter signal remains normal throughout the brain. No encephalomalacia or chronic cerebral blood products. No abnormal enhancement identified.  No dural thickening. Vascular: Major intracranial vascular flow voids are stable and within normal limits. Following contrast the major dural venous sinuses are enhancing and appear to be patent. Skull and upper cervical spine: Normal visible cervical spine. Visualized bone marrow signal is within normal limits. Sinuses/Orbits: Normal orbits. Mild chronic maxillary sinus mucosal thickening or retention cysts, stable since 2019. Other paranasal sinuses, mastoids are clear. Other: Visible internal auditory structures appear normal. Negative visible scalp and face. IMPRESSION: Normal MRI appearance of the Brain. Electronically Signed   By: Odessa Fleming M.D.   On: 11/11/2022 09:38   CT Head Wo Contrast  Result Date: 11/11/2022 CLINICAL DATA:  42 year old female with right face and arm numbness onset 0600 hours. EXAM: CT HEAD WITHOUT CONTRAST TECHNIQUE: Contiguous axial images were obtained from the base of the skull through the vertex without intravenous contrast. RADIATION DOSE REDUCTION: This exam was performed according to the departmental dose-optimization program which includes automated exposure control, adjustment of the mA and/or kV according to patient size and/or use of iterative reconstruction technique. COMPARISON:  Brain MRI 05/25/2018. FINDINGS: Brain: Cerebral volume is stable and within normal limits. No midline shift, ventriculomegaly, mass effect, evidence of mass lesion, intracranial hemorrhage or evidence of cortically based acute infarction. Gray-white matter differentiation is within normal limits throughout the brain. Partially empty sella appearance on the sagittal, but probably artifactual and would be  new compared to 2019. Series 2, image 6 suggests there is pituitary parenchyma visible in the sella. Vascular: No suspicious intracranial vascular hyperdensity. Skull: Mild scaphocephaly.  No osseous abnormality identified. Sinuses/Orbits: Visualized paranasal sinuses and mastoids are clear. Other: Visualized orbits and scalp soft tissues are within normal limits. IMPRESSION: Normal noncontrast Head CT. Electronically Signed   By: Odessa Fleming M.D.   On: 11/11/2022 07:14    Procedures Procedures    Medications Ordered in ED Medications  gadobutrol (GADAVIST) 1 MMOL/ML injection 5 mL (5 mLs Intravenous Contrast Given 11/11/22 1610)    ED Course/ Medical Decision Making/ A&P                             Medical Decision Making Amount and/or Complexity of Data Reviewed Radiology: ordered.  Risk Prescription drug management.   Patient with paresthesias to right side.  Does have history anxiety.  Differential diagnosis includes  paresthesias, stroke, MS, nonspecific paresthesias.  Head CT reassuring.  Potentially could be a component of anxiety since still having some tachycardia.  However I think we need further workup.  Since involving face would have to be in the brain.  Will get MRI with and without contrast to further evaluate.  Causes such as MS considered.  Complicated migraine also considered but no current headache.  MRI done and reassuring.  No clear cause of the paresthesias.  Potentially could be due to anxiety, however does not appear to be other cause such as MS.  Has seen neurology in the past for the headaches.  Appears stable for outpatient follow-up.  Will discharge.        Final Clinical Impression(s) / ED Diagnoses Final diagnoses:  Paresthesia    Rx / DC Orders ED Discharge Orders     None         Benjiman Core, MD 11/11/22 1006

## 2022-11-11 NOTE — ED Triage Notes (Signed)
Pt began feeling right arm numbness and tingling at 1800 ;ast night. Tried to go to sleep. This morning the numbness spread to left arm and right leg. Pt ambulatory. No facial droop. No weakness. LSW at 1800 11/10/22. Pt was recently seen for reflux and anxiety. Has not taken anxiety meds yet

## 2022-11-21 ENCOUNTER — Ambulatory Visit
Admission: RE | Admit: 2022-11-21 | Discharge: 2022-11-21 | Disposition: A | Discharge status: Home / Self Care | Payer: Commercial Managed Care - PPO | Source: Ambulatory Visit | Attending: Family Medicine | Admitting: Family Medicine

## 2022-11-21 VITALS — BP 132/80 | HR 89 | Temp 98.0°F | Resp 18

## 2022-11-21 DIAGNOSIS — J029 Acute pharyngitis, unspecified: Secondary | ICD-10-CM | POA: Diagnosis present

## 2022-11-21 LAB — POCT RAPID STREP A (OFFICE): Rapid Strep A Screen: NEGATIVE

## 2022-11-21 MED ORDER — IBUPROFEN 800 MG PO TABS: 800.0000 mg | ORAL_TABLET | Freq: Three times a day (TID) | ORAL | 0 refills | Status: AC | PRN

## 2022-11-21 MED ORDER — TIZANIDINE HCL 4 MG PO TABS: 4.0000 mg | ORAL_TABLET | Freq: Three times a day (TID) | ORAL | 0 refills | Status: DC | PRN

## 2022-11-21 MED ORDER — AMOXICILLIN 875 MG PO TABS: 875.0000 mg | ORAL_TABLET | Freq: Two times a day (BID) | ORAL | 0 refills | Status: AC

## 2022-11-21 NOTE — ED Triage Notes (Signed)
Pt c/o a knot feeling to center of lower back since Monday. Denies pain or injury.  Pt c/o sore throat with swollen lymph nodes since Wednesday night. States had strep throat last month.

## 2022-11-21 NOTE — Discharge Instructions (Signed)
Your strep test is negative.  Culture of the throat will be sent, and staff will notify you if that is in turn positive.  Staff will also notify you if that culture is negative, since I am going to go ahead and send in amoxicillin for possible strep. If the throat culture is negative, then this is a virus most likely or allergies causing your symptoms and I would stop the antibiotic   Take amoxicillin 875 mg--1 tab twice daily for 10 days  Take ibuprofen 800 mg--1 tab every 8 hours as needed for pain.   Take tizanidine 4 mg--1 every 8 hours as needed for muscle spasms; this medication can cause dizziness and sleepiness

## 2022-11-21 NOTE — ED Provider Notes (Signed)
[facility]    CSN: [id number] Arrival date & time: 11/21/22  0841      History   Chief Complaint Chief Complaint  Patient presents with   Back Pain    Having pain in multiple locations, possible Strep infection - Entered by patient   Sore Throat    HPI Lorraine West is a 42 y.o. female.    Back Pain Sore Throat   Here for sore throat that began bothering her yesterday.  She also noted some sore lymph nodes in her neck under her jaw and in her axilla bilaterally.  No fever.  She has had a little cough but she thinks it might be due to reflux  She is also had some back pain sometimes at that near her neck and right now it is a "knot" in her right lower thoracic area.  She was seen in the emergency room for paresthesias in her right arm last week. She notes having strep last month  Past Medical History:  Diagnosis Date   Alpha thalassemia (HCC)    Asthma    BRCA gene mutation negative in female    second time- she was tested negative when she was 42 years old   BRCA gene mutation positive in female    1st testing was positive when she was 42 years old   Heart murmur    MVP   Migraine     Patient Active Problem List   Diagnosis Date Noted   Chronic migraine without aura without status migrainosus, not intractable 05/19/2018   Genetic testing 07/14/2016   Family history of breast cancer in female 06/18/2016   Vaginal delivery 03/08/2016   Second-degree perineal laceration, with delivery 03/08/2016   Alpha thalassemia (HCC) 03/08/2016    Past Surgical History:  Procedure Laterality Date   BREAST BIOPSY Right 06/10/2022   [place] RT BREAST BX W LOC DEV 1ST LESION IMG BX SPEC US GUIDE 06/10/2022 [facility]MOGRAPHY   NO PAST SURGERIES      OB History     Gravida  3   Para  3   Term  3   Preterm  0   AB  0   Living  3      SAB  0   IAB  0   Ectopic  0   Multiple  0   Live Births  3            Home Medications    Prior to  Admission medications   Medication Sig Start Date End Date Taking? Authorizing Provider  amoxicillin (AMOXIL) 875 MG tablet Take 1 tablet (875 mg total) by mouth 2 (two) times daily for 10 days. 11/21/22 12/01/22 Yes Zenia Resides, MD  ibuprofen (ADVIL) 800 MG tablet Take 1 tablet (800 mg total) by mouth every 8 (eight) hours as needed (pain). 11/21/22  Yes Kadyn Chovan, Janace Aris, MD  tiZANidine (ZANAFLEX) 4 MG tablet Take 1 tablet (4 mg total) by mouth every 8 (eight) hours as needed for muscle spasms. 11/21/22  Yes Zenia Resides, MD  iron polysaccharides (NIFEREX) 150 MG capsule Take 1 capsule (150 mg total) by mouth 2 (two) times daily. 10/12/19   Johney Maine, MD  pantoprazole (PROTONIX) 40 MG tablet Take 1 tablet (40 mg total) by mouth daily. 02/26/21     PRESCRIPTION MEDICATION IUD    [provider]  rizatriptan (MAXALT-MLT) 10 MG disintegrating tablet DISSOLVE 1 TABLET BY MOUTH DAILY AS NEEDED FOR MIGRAINE. MAY  REPEAT IN 2 HOURS IF NEEDED 11/27/20   Lomax, Amy, NP  topiramate (TOPAMAX) 100 MG tablet Take 1 tablet (100 mg total) by mouth 2 (two) times daily. 11/27/20   Shawnie Dapper, NP    Family History Family History  Problem Relation Age of Onset   Breast cancer Mother 69       d. 95y   Migraines Mother    Diabetes Father    Cervical cancer Maternal Aunt        d. 62y   Colon cancer Maternal Grandmother        dx 82-83y   Heart disease Maternal Grandfather    Heart Problems Maternal Grandfather    Cancer Maternal Grandfather        NOS cancer   Kidney cancer Paternal Grandmother        dx unspecified age   Lung cancer Paternal Grandfather        dx unspecified age   Brain cancer Cousin 68       maternal 1st cousin d. 55y; NOS brain tumor   Breast cancer Cousin 24       maternal 1st cousin   Prostate cancer Cousin 19       maternal 1st cousin d. 25y   Breast cancer Cousin 65    Social History Social History   Tobacco Use   Smoking status: Never    Smokeless tobacco: Never  Vaping Use   Vaping Use: Never used  Substance Use Topics   Alcohol use: No   Drug use: Never     Allergies   Gluten meal   Review of Systems Review of Systems  Musculoskeletal:  Positive for back pain.     Physical Exam Triage Vital Signs ED Triage Vitals  Enc Vitals Group     BP 11/21/22 0852 132/80     Pulse Rate 11/21/22 0852 89     Resp 11/21/22 0852 18     Temp 11/21/22 0852 98 F (36.7 C)     Temp Source 11/21/22 0852 Oral     SpO2 11/21/22 0852 99 %     Weight --      Height --      Head Circumference --      Peak Flow --      Pain Score 11/21/22 0853 3     Pain Loc --      Pain Edu? --      Excl. in GC? --    No data found.  Updated Vital Signs BP 132/80 (BP Location: Left Arm)   Pulse 89   Temp 98 F (36.7 C) (Oral)   Resp 18   SpO2 99%   Visual Acuity Right Eye Distance:   Left Eye Distance:   Bilateral Distance:    Right Eye Near:   Left Eye Near:    Bilateral Near:     Physical Exam Vitals reviewed.  Constitutional:      General: She is not in acute distress.    Appearance: She is not toxic-appearing.  HENT:     Right Ear: Tympanic membrane and ear canal normal.     Left Ear: Tympanic membrane and ear canal normal.     Nose: Nose normal.     Mouth/Throat:     Mouth: Mucous membranes are moist.     Comments: There is erythema of the posterior oropharynx. Eyes:     Extraocular Movements: Extraocular movements intact.     Conjunctiva/sclera: Conjunctivae normal.  Pupils: Pupils are equal, round, and reactive to light.  Cardiovascular:     Rate and Rhythm: Normal rate and regular rhythm.     Heart sounds: No murmur heard. Pulmonary:     Effort: Pulmonary effort is normal. No respiratory distress.     Breath sounds: No wheezing, rhonchi or rales.  Chest:     Chest wall: No tenderness.  Musculoskeletal:     Cervical back: Neck supple.  Lymphadenopathy:     Cervical: No cervical adenopathy.   Skin:    Capillary Refill: Capillary refill takes less than 2 seconds.     Coloration: Skin is not jaundiced or pale.  Neurological:     General: No focal deficit present.     Mental Status: She is alert and oriented to person, place, and time.  Psychiatric:        Behavior: Behavior normal.      UC Treatments / Results  Labs (all labs ordered are listed, but only abnormal results are displayed) Labs Reviewed  CULTURE, GROUP A STREP [facility])  POCT RAPID STREP A (OFFICE)    EKG   Radiology No results found.  Procedures Procedures (including critical care time)  Medications Ordered in UC Medications - No data to display  Initial Impression / Assessment and Plan / UC Course  I have reviewed the triage vital signs and the nursing notes.  Pertinent labs & imaging results that were available during my care of the patient were reviewed by me and considered in my medical decision making (see chart for details).        Rapid strep is negative.  I will go ahead and treat empirically with amoxicillin but throat culture is sent and I am stop antibiotics if it is negative  Ibuprofen and tizanidine are sent in for the back pain. Final Clinical Impressions(s) / UC Diagnoses   Final diagnoses:  Acute pharyngitis, unspecified etiology     Discharge Instructions      Your strep test is negative.  Culture of the throat will be sent, and staff will notify you if that is in turn positive.  Staff will also notify you if that culture is negative, since I am going to go ahead and send in amoxicillin for possible strep. If the throat culture is negative, then this is a virus most likely or allergies causing your symptoms and I would stop the antibiotic   Take amoxicillin 875 mg--1 tab twice daily for 10 days  Take ibuprofen 800 mg--1 tab every 8 hours as needed for pain.   Take tizanidine 4 mg--1 every 8 hours as needed for muscle spasms; this medication can cause dizziness  and sleepiness        ED Prescriptions     Medication Sig Dispense Auth. Provider   amoxicillin (AMOXIL) 875 MG tablet Take 1 tablet (875 mg total) by mouth 2 (two) times daily for 10 days. 20 tablet Zenia Resides, MD   ibuprofen (ADVIL) 800 MG tablet Take 1 tablet (800 mg total) by mouth every 8 (eight) hours as needed (pain). 21 tablet Navayah Sok, Janace Aris, MD   tiZANidine (ZANAFLEX) 4 MG tablet Take 1 tablet (4 mg total) by mouth every 8 (eight) hours as needed for muscle spasms. 10 tablet Marlinda Mike Janace Aris, MD      PDMP not reviewed this encounter.   Zenia Resides, MD 11/21/22 [phone]

## 2022-11-25 LAB — CULTURE, GROUP A STREP (THRC)

## 2022-11-26 ENCOUNTER — Encounter: Payer: Self-pay | Admitting: Nurse Practitioner

## 2022-12-01 ENCOUNTER — Other Ambulatory Visit: Payer: Commercial Managed Care - PPO

## 2022-12-10 ENCOUNTER — Other Ambulatory Visit: Payer: Self-pay | Admitting: Nurse Practitioner

## 2022-12-10 DIAGNOSIS — Z809 Family history of malignant neoplasm, unspecified: Secondary | ICD-10-CM

## 2022-12-10 DIAGNOSIS — Z803 Family history of malignant neoplasm of breast: Secondary | ICD-10-CM

## 2022-12-15 ENCOUNTER — Encounter (HOSPITAL_COMMUNITY): Payer: Self-pay | Admitting: Emergency Medicine

## 2022-12-15 ENCOUNTER — Emergency Department (HOSPITAL_COMMUNITY): Payer: Commercial Managed Care - PPO

## 2022-12-15 ENCOUNTER — Other Ambulatory Visit: Payer: Self-pay

## 2022-12-15 ENCOUNTER — Emergency Department (HOSPITAL_COMMUNITY)
Admission: EM | Admit: 2022-12-15 | Discharge: 2022-12-16 | Disposition: A | Discharge status: Home / Self Care | Payer: Commercial Managed Care - PPO | Attending: Emergency Medicine | Admitting: Emergency Medicine

## 2022-12-15 DIAGNOSIS — R1084 Generalized abdominal pain: Secondary | ICD-10-CM | POA: Diagnosis not present

## 2022-12-15 DIAGNOSIS — J45909 Unspecified asthma, uncomplicated: Secondary | ICD-10-CM | POA: Insufficient documentation

## 2022-12-15 DIAGNOSIS — R1031 Right lower quadrant pain: Secondary | ICD-10-CM | POA: Diagnosis present

## 2022-12-15 LAB — BASIC METABOLIC PANEL
Anion gap: 10 (ref 5–15)
BUN: 8 mg/dL (ref 6–20)
CO2: 26 mmol/L (ref 22–32)
Calcium: 9.2 mg/dL (ref 8.9–10.3)
Chloride: 102 mmol/L (ref 98–111)
Creatinine, Ser: 1.02 mg/dL — ABNORMAL HIGH (ref 0.44–1.00)
GFR, Estimated: >60 mL/min (ref >60)
Glucose, Bld: 151 mg/dL — ABNORMAL HIGH (ref 70–99)
Potassium: 3.6 mmol/L (ref 3.5–5.1)
Sodium: 138 mmol/L (ref 135–145)

## 2022-12-15 LAB — CBC WITH DIFFERENTIAL/PLATELET
Abs Immature Granulocytes: 0.03 10*3/uL (ref 0.00–0.07)
Basophils Absolute: 0 10*3/uL (ref 0.0–0.1)
Basophils Relative: 0 %
Eosinophils Absolute: 0.1 10*3/uL (ref 0.0–0.5)
Eosinophils Relative: 1 %
HCT: 41.1 % (ref 36.0–46.0)
Hemoglobin: 12.4 g/dL (ref 12.0–15.0)
Immature Granulocytes: 0 %
Lymphocytes Relative: 27 %
Lymphs Abs: 3.3 10*3/uL (ref 0.7–4.0)
MCH: 21.7 pg — ABNORMAL LOW (ref 26.0–34.0)
MCHC: 30.2 g/dL (ref 30.0–36.0)
MCV: 72 fL — ABNORMAL LOW (ref 80.0–100.0)
Monocytes Absolute: 0.5 10*3/uL (ref 0.1–1.0)
Monocytes Relative: 4 %
Neutro Abs: 8.1 10*3/uL — ABNORMAL HIGH (ref 1.7–7.7)
Neutrophils Relative %: 68 %
Platelets: 338 10*3/uL (ref 150–400)
RBC: 5.71 MIL/uL — ABNORMAL HIGH (ref 3.87–5.11)
RDW: 15.1 % (ref 11.5–15.5)
WBC: 12 10*3/uL — ABNORMAL HIGH (ref 4.0–10.5)
nRBC: 0 % (ref 0.0–0.2)

## 2022-12-15 LAB — URINALYSIS, ROUTINE W REFLEX MICROSCOPIC
Bilirubin Urine: NEGATIVE
Glucose, UA: NEGATIVE mg/dL
Hgb urine dipstick: NEGATIVE
Ketones, ur: 5 mg/dL — AB
Leukocytes,Ua: NEGATIVE
Nitrite: NEGATIVE
Protein, ur: NEGATIVE mg/dL
Specific Gravity, Urine: 1.025 (ref 1.005–1.030)
pH: 5 (ref 5.0–8.0)

## 2022-12-15 LAB — I-STAT BETA HCG BLOOD, ED (MC, WL, AP ONLY): I-stat hCG, quantitative: <5 m[IU]/mL (ref <5)

## 2022-12-15 NOTE — ED Triage Notes (Signed)
Pt states she was seen 1 mo ago for parasthesia and pain to R abdomen and R low back. Pt states this has worsened since. Pt concerned about kidney issues, and is reporting burning in chest.

## 2022-12-16 ENCOUNTER — Other Ambulatory Visit: Payer: Self-pay

## 2022-12-16 MED ORDER — DICYCLOMINE HCL 20 MG PO TABS
20.0000 mg | ORAL_TABLET | Freq: Two times a day (BID) | ORAL | 0 refills | Status: DC
  Filled 2022-12-16: qty 20, 10d supply, fill #0

## 2022-12-16 MED ORDER — DICYCLOMINE HCL 20 MG PO TABS: 20.0000 mg | ORAL_TABLET | Freq: Two times a day (BID) | ORAL | 0 refills | Status: DC

## 2022-12-16 NOTE — ED Provider Notes (Signed)
[facility] Emergency Department Provider Note MRN:  [id number]  Arrival date & time: 12/16/22     Chief Complaint     History of Present Illness   Lorraine West is a 42 y.o. year-old female with no pertinent past medical history presenting to the ED with chief complaint of abdominal pain  Pain to the right flank and right lower quadrant on and off for the past month, worse over the past week.  No fever, no nausea vomiting or diarrhea.  Review of Systems  A thorough review of systems was obtained and all systems are negative except as noted in the HPI and PMH.   Patient's Health History    Past Medical History:  Diagnosis Date   Alpha thalassemia (HCC)    Asthma    BRCA gene mutation negative in female    second time- she was tested negative when she was 42 years old   BRCA gene mutation positive in female    1st testing was positive when she was 42 years old   Heart murmur    MVP   Migraine     Past Surgical History:  Procedure Laterality Date   BREAST BIOPSY Right 06/10/2022   [place] RT BREAST BX W LOC DEV 1ST LESION IMG BX SPEC US GUIDE 06/10/2022 [facility] MAMMOGRAPHY   NO PAST SURGERIES      Family History  Problem Relation Age of Onset   Breast cancer Mother 76       d. 39y   Migraines Mother    Diabetes Father    Cervical cancer Maternal Aunt        d. 62y   Colon cancer Maternal Grandmother        dx 82-83y   Heart disease Maternal Grandfather    Heart Problems Maternal Grandfather    Cancer Maternal Grandfather        NOS cancer   Kidney cancer Paternal Grandmother        dx unspecified age   Lung cancer Paternal Grandfather        dx unspecified age   Brain cancer Cousin 23       maternal 1st cousin d. 66y; NOS brain tumor   Breast cancer Cousin 53       maternal 1st cousin   Prostate cancer Cousin 25       maternal 1st cousin d. 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   Right handed   Lives at home with her children    Currently separated, not legally   Caffeine: 1 cup of coffee/tea daily   Social Determinants of Health   Financial Resource Strain: Not on file  Food Insecurity: Not on file  Transportation Needs: Not on file  Physical Activity: Not on file  Stress: Not on file  Social Connections: Not on file  Intimate Partner Violence: Not on file     Physical Exam   Vitals:   12/16/22 0330 12/16/22 0345  BP: 96/72   Pulse: (!) 102 (!) 106  Resp: 15   Temp:    SpO2: 99% 100%    CONSTITUTIONAL: Well-appearing, NAD NEURO/PSYCH:  Alert and oriented x 3, no focal deficits EYES:  eyes equal and reactive ENT/NECK:  no LAD, no JVD CARDIO: Regular rate, well-perfused, normal S1 and S2 PULM:  CTAB no wheezing or rhonchi GI/GU:  non-distended, non-tender MSK/SPINE:  No gross deformities, no edema SKIN:  no rash, atraumatic   *Additional and/or pertinent findings included in MDM below  Diagnostic and Interventional Summary    EKG Interpretation  Date/Time:  Monday Dec 15 2022 20:15:08 EDT Ventricular Rate:  120 PR Interval:  116 QRS Duration: 70 QT Interval:  294 QTC Calculation: 415 R Axis:   64 Text Interpretation: Sinus tachycardia Right atrial enlargement Nonspecific ST abnormality Abnormal ECG When compared with ECG of 11-Nov-2022 05:09, PREVIOUS ECG IS PRESENT Confirmed by Kennis Carina [phone] on 12/16/2022 3:37:48 AM       Labs Reviewed   CBC WITH DIFFERENTIAL/PLATELET - Abnormal; Notable for the following components:      Result Value   WBC 12.0 (*)    RBC 5.71 (*)    MCV 72.0 (*)    MCH 21.7 (*)    Neutro Abs 8.1 (*)    All other components within normal limits  BASIC METABOLIC PANEL - Abnormal; Notable for the following components:   Glucose, Bld 151 (*)    Creatinine, Ser 1.02 (*)    All other components within normal limits  URINALYSIS, ROUTINE W REFLEX MICROSCOPIC - Abnormal; Notable for the following components:   Ketones, ur 5 (*)    All other components within normal limits  I-STAT BETA HCG BLOOD, ED ([facility], [facility], [facility] ONLY)    CT Renal Stone Study  Final Result      Medications - No data to display   Procedures  /  Critical Care Procedures  ED Course and Medical Decision Making  Initial Impression and Ddx Differential diagnosis includes kidney stone, appendicitis, diverticulitis, ovarian cyst  Past medical/surgical history that increases complexity of ED encounter: None  Interpretation of Diagnostics I personally reviewed the laboratory assessment and my interpretation is as follows: No significant blood count or electrolyte disturbance  CT revealing a bit of an enlarged appendix, likely normal variant.  Patient Reassessment and Ultimate Disposition/Management     Patient has absolutely no tenderness to the right lower quadrant and so highly doubt early appendicitis.  Appropriate for discharge with GI follow-up.  Patient management required discussion with the following services or consulting groups:  None  Complexity of Problems Addressed Acute illness or injury that poses threat of life of bodily function  Additional Data Reviewed and Analyzed Further history obtained from: Prior ED visit notes and Prior labs/imaging results  Additional Factors Impacting ED Encounter Risk Prescriptions  Elmer Sow. Pilar Plate, MD [facility] Emergency [facility]  Health [email or web address]  Final Clinical Impressions(s) / ED Diagnoses     ICD-10-CM   1. Generalized abdominal pain  R10.84       ED Discharge Orders          Ordered    dicyclomine (BENTYL) 20 MG tablet  2 times daily        12/16/22 0401  Discharge Instructions Discussed with and Provided to Patient:    Discharge Instructions      You were evaluated in the Emergency Department and after careful evaluation, we did not find any emergent condition requiring admission or further testing in the hospital.  Your exam/testing today is overall reassuring.  Recommend trying the Bentyl for your abdominal pain.  Keep your follow-up with your primary care doctor, discussed the need for GI referral.  Please return to the Emergency Department if you experience any worsening of your condition.   Thank you for allowing [place] to be a part of your care.      Sabas Sous, MD 12/16/22 [phone]

## 2022-12-16 NOTE — Discharge Instructions (Addendum)
You were evaluated in the Emergency Department and after careful evaluation, we did not find any emergent condition requiring admission or further testing in the hospital.  Your exam/testing today is overall reassuring.  Recommend trying the Bentyl for your abdominal pain.  Keep your follow-up with your primary care doctor, discussed the need for GI referral.  Please return to the Emergency Department if you experience any worsening of your condition.   Thank you for allowing [place] to be a part of your care.

## 2022-12-16 NOTE — ED Notes (Signed)
Discharge instructions discussed with pt. Pt verbalized understanding with no questions at this time.  

## 2022-12-26 ENCOUNTER — Encounter: Payer: Self-pay | Admitting: Nurse Practitioner

## 2022-12-30 ENCOUNTER — Encounter: Payer: Self-pay | Admitting: Nurse Practitioner

## 2023-01-08 ENCOUNTER — Encounter: Payer: Self-pay | Admitting: Neurology

## 2023-01-08 ENCOUNTER — Ambulatory Visit (INDEPENDENT_AMBULATORY_CARE_PROVIDER_SITE_OTHER): Payer: Commercial Managed Care - PPO | Admitting: Neurology

## 2023-01-08 VITALS — BP 111/73 | HR 90 | Ht 59.25 in | Wt 121.4 lb

## 2023-01-08 DIAGNOSIS — G8191 Hemiplegia, unspecified affecting right dominant side: Secondary | ICD-10-CM

## 2023-01-08 DIAGNOSIS — D560 Alpha thalassemia: Secondary | ICD-10-CM

## 2023-01-08 DIAGNOSIS — G08 Intracranial and intraspinal phlebitis and thrombophlebitis: Secondary | ICD-10-CM

## 2023-01-08 DIAGNOSIS — R270 Ataxia, unspecified: Secondary | ICD-10-CM

## 2023-01-08 DIAGNOSIS — R2689 Other abnormalities of gait and mobility: Secondary | ICD-10-CM

## 2023-01-08 DIAGNOSIS — R200 Anesthesia of skin: Secondary | ICD-10-CM

## 2023-01-08 DIAGNOSIS — R269 Unspecified abnormalities of gait and mobility: Secondary | ICD-10-CM

## 2023-01-08 DIAGNOSIS — J341 Cyst and mucocele of nose and nasal sinus: Secondary | ICD-10-CM

## 2023-01-08 DIAGNOSIS — E538 Deficiency of other specified B group vitamins: Secondary | ICD-10-CM | POA: Diagnosis not present

## 2023-01-08 NOTE — Progress Notes (Addendum)
[facility]    Provider:  Dr Lucia Gaskins Referring Provider: Georgianne Fick, MD Primary Care Physician:  Georgianne Fick, MD  CC:  Headache  01/08/2023: Patient followed up once in 2022 with my NP and she was continued on topiramate. has Vaginal delivery; Second-degree perineal laceration, with delivery; Alpha thalassemia (HCC); Family history of breast cancer in female; Genetic testing; and Chronic migraine without aura without status migrainosus, not intractable on their problem list.  She was in the ED and right arm felt numb then the right leg as well. Face she felt pressure. She still has it. Pain in her neck. Entire right arm and leg. She also reports acute onset weakness, she was in the emergency room.  The symptoms had begun the night before, she did not have a migraine was not thought to be an aura, she felt as though the arm and leg and face all felt strange, had never had an aura such as this, she did not appear anxious, no trouble breathing, no chest pain, in the emergency room she felt paresthesias to the right face, right upper and lower extremities, strength was intact but she states she has right-sided weakness, sensation was intact but she states she that she felt it was different.  MRI of the brain was unremarkable.  She states she did not feel anxious but the notes did show that she was tachycardic.  Given her history of alpha thalassemia however she needs a complete workup.she still has the right-sided sensations in the arm and leg. And weakness. Also off balance and gait abnormality. No other focal neurologic deficits, associated symptoms, inciting events or modifiable factors.      HPI11/23/2021: Here for new equest of headaches. Worsening. Headaches now daily. PMHx migraines since 2011. MRI of the brain was normal (reviewed images) from 05/25/2018: When we saw her last she was worried about her cousin who had a brain tumor and wanted an MRI which was  normal(personally reviewed images). Today back to discuss migraine management. Migraines start on the right side and travels behinds the eyes but can start on the left behind the eye as well, photophobia/phonoohobia,nausea but no vomiting, can last 24-72 hours untreated and recently she feels they are continuous. Daily headaches, at least 15 migraine days a month that are moderately severe to severe, OTC like excedrin do not help, a dark room may help. No aura. No other focal neurologic deficits, associated symptoms, inciting events or modifiable factors.  meds tried: propranol, tizanidine, tylenol, celebrex, benadryl, ibuprofen, zofran,tizanidin  Cbc with slight anemia, cmp unremarkable  Recent Results (from the past 2160 hour(s))  Pregnancy, urine     Status: None   Collection Time: 11/11/22  5:18 AM  Result Value Ref Range   Preg Test, Ur NEGATIVE NEGATIVE    Comment:        THE SENSITIVITY OF THIS METHODOLOGY IS >20 mIU/mL. Performed at [facility], [address], [place], [place] [address]   Urinalysis, Routine w reflex microscopic -Urine, Clean Catch     Status: Abnormal   Collection Time: 11/11/22  5:18 AM  Result Value Ref Range   Color, Urine YELLOW YELLOW   APPearance CLEAR CLEAR   Specific Gravity, Urine 1.010 1.005 - 1.030   pH 5.0 5.0 - 8.0   Glucose, UA NEGATIVE NEGATIVE mg/dL   Hgb urine dipstick NEGATIVE NEGATIVE   Bilirubin Urine NEGATIVE NEGATIVE   Ketones, ur 20 (A) NEGATIVE mg/dL   Protein, ur NEGATIVE NEGATIVE mg/dL  Nitrite NEGATIVE NEGATIVE   Leukocytes,Ua NEGATIVE NEGATIVE    Comment: Performed at [facility], [address], [place], [place] [address]  Urine rapid drug screen (hosp performed)     Status: None   Collection Time: 11/11/22  5:18 AM  Result Value Ref Range   Opiates NONE DETECTED NONE DETECTED   Cocaine NONE DETECTED NONE DETECTED   Benzodiazepines NONE DETECTED NONE DETECTED   Amphetamines NONE DETECTED NONE DETECTED    Tetrahydrocannabinol NONE DETECTED NONE DETECTED   Barbiturates NONE DETECTED NONE DETECTED    Comment: (NOTE) DRUG SCREEN FOR MEDICAL PURPOSES ONLY.  IF CONFIRMATION IS NEEDED FOR ANY PURPOSE, NOTIFY LAB WITHIN 5 DAYS.  LOWEST DETECTABLE LIMITS FOR URINE DRUG SCREEN Drug Class                     Cutoff (ng/mL) Amphetamine and metabolites    1000 Barbiturate and metabolites    200 Benzodiazepine                 200 Opiates and metabolites        300 Cocaine and metabolites        300 THC                            50 Performed at [facility], [address], [place], [place] [address]   Ethanol     Status: None   Collection Time: 11/11/22  5:22 AM  Result Value Ref Range   Alcohol, Ethyl (B) <10 <10 mg/dL    Comment: (NOTE) Lowest detectable limit for serum alcohol is 10 mg/dL.  For medical purposes only. Performed at [facility], [address], [place], [place] [address]   Basic metabolic panel     Status: Abnormal   Collection Time: 11/11/22  5:23 AM  Result Value Ref Range   Sodium 135 135 - 145 mmol/L   Potassium 3.5 3.5 - 5.1 mmol/L   Chloride 101 98 - 111 mmol/L   CO2 22 22 - 32 mmol/L   Glucose, Bld 115 (H) 70 - 99 mg/dL    Comment: Glucose reference range applies only to samples taken after fasting for at least 8 hours.   BUN 5 (L) 6 - 20 mg/dL   Creatinine, Ser 9.14 0.44 - 1.00 mg/dL   Calcium 8.8 (L) 8.9 - 10.3 mg/dL   GFR, Estimated >78 >29 mL/min    Comment: (NOTE) Calculated using the CKD-EPI Creatinine Equation (2021)    Anion gap 12 5 - 15    Comment: Performed at [facility], [address] [place], [place] [address]  CBC with Differential     Status: Abnormal   Collection Time: 11/11/22  5:23 AM  Result Value Ref Range   WBC 9.1 4.0 - 10.5 K/uL   RBC 5.39 (H) 3.87 - 5.11 MIL/uL   Hemoglobin 12.1 12.0 - 15.0 g/dL   HCT 08.6 57.8 - 46.9 %   MCV 69.8 (L) 80.0 - 100.0 fL   MCH 22.4 (L) 26.0 - 34.0 pg   MCHC 32.2 30.0 -  36.0 g/dL   RDW 62.9 52.8 - 41.3 %   Platelets 265 150 - 400 K/uL    Comment: REPEATED TO VERIFY   nRBC 0.0 0.0 - 0.2 %   Neutrophils Relative % 76 %   Neutro Abs 6.8 1.7 - 7.7 K/uL   Lymphocytes Relative 20 %  Lymphs Abs 1.8 0.7 - 4.0 K/uL   Monocytes Relative 3 %   Monocytes Absolute 0.3 0.1 - 1.0 K/uL   Eosinophils Relative 1 %   Eosinophils Absolute 0.1 0.0 - 0.5 K/uL   Basophils Relative 0 %   Basophils Absolute 0.0 0.0 - 0.1 K/uL   Immature Granulocytes 0 %   Abs Immature Granulocytes 0.02 0.00 - 0.07 K/uL    Comment: Performed at [facility], [address], [place], [place] [address]  Protime-INR     Status: None   Collection Time: 11/11/22  5:23 AM  Result Value Ref Range   Prothrombin Time 14.2 11.4 - 15.2 seconds   INR 1.1 0.8 - 1.2    Comment: (NOTE) INR goal varies based on device and disease states. Performed at [facility], [address], [place], [place] [address]   POCT rapid strep A     Status: None   Collection Time: 11/21/22  9:01 AM  Result Value Ref Range   Rapid Strep A Screen Negative Negative  Culture, group A strep     Status: None   Collection Time: 11/21/22  9:18 AM   Specimen: Throat  Result Value Ref Range   Specimen Description THROAT    Special Requests NONE    Culture      NO GROUP A STREP (S.PYOGENES) ISOLATED Performed at [facility], [address], [place], [place] [address]    Report Status 11/25/2022 FINAL   CBC with Differential     Status: Abnormal   Collection Time: 12/15/22  8:19 PM  Result Value Ref Range   WBC 12.0 (H) 4.0 - 10.5 K/uL   RBC 5.71 (H) 3.87 - 5.11 MIL/uL   Hemoglobin 12.4 12.0 - 15.0 g/dL   HCT 46.9 62.9 - 52.8 %   MCV 72.0 (L) 80.0 - 100.0 fL   MCH 21.7 (L) 26.0 - 34.0 pg   MCHC 30.2 30.0 - 36.0 g/dL   RDW 41.3 24.4 - 01.0 %   Platelets 338 150 - 400 K/uL   nRBC 0.0 0.0 - 0.2 %   Neutrophils Relative % 68 %   Neutro Abs 8.1 (H) 1.7 - 7.7 K/uL   Lymphocytes Relative 27 %    Lymphs Abs 3.3 0.7 - 4.0 K/uL   Monocytes Relative 4 %   Monocytes Absolute 0.5 0.1 - 1.0 K/uL   Eosinophils Relative 1 %   Eosinophils Absolute 0.1 0.0 - 0.5 K/uL   Basophils Relative 0 %   Basophils Absolute 0.0 0.0 - 0.1 K/uL   Immature Granulocytes 0 %   Abs Immature Granulocytes 0.03 0.00 - 0.07 K/uL    Comment: Performed at [facility], [address] [place], [place] [address]  Basic metabolic panel     Status: Abnormal   Collection Time: 12/15/22  8:19 PM  Result Value Ref Range   Sodium 138 135 - 145 mmol/L   Potassium 3.6 3.5 - 5.1 mmol/L   Chloride 102 98 - 111 mmol/L   CO2 26 22 - 32 mmol/L   Glucose, Bld 151 (H) 70 - 99 mg/dL    Comment: Glucose reference range applies only to samples taken after fasting for at least 8 hours.   BUN 8 6 - 20 mg/dL   Creatinine, Ser 6.64 (H) 0.44 - 1.00 mg/dL   Calcium 9.2 8.9 - 40.3 mg/dL   GFR, Estimated >47 >42 mL/min    Comment: (NOTE) Calculated using the  CKD-EPI Creatinine Equation (2021)    Anion gap 10 5 - 15    Comment: Performed at [facility], [address], [place], [place] [address]  Urinalysis, Routine w reflex microscopic -Urine, Clean Catch     Status: Abnormal   Collection Time: 12/15/22  8:22 PM  Result Value Ref Range   Color, Urine YELLOW YELLOW   APPearance CLEAR CLEAR   Specific Gravity, Urine 1.025 1.005 - 1.030   pH 5.0 5.0 - 8.0   Glucose, UA NEGATIVE NEGATIVE mg/dL   Hgb urine dipstick NEGATIVE NEGATIVE   Bilirubin Urine NEGATIVE NEGATIVE   Ketones, ur 5 (A) NEGATIVE mg/dL   Protein, ur NEGATIVE NEGATIVE mg/dL   Nitrite NEGATIVE NEGATIVE   Leukocytes,Ua NEGATIVE NEGATIVE    Comment: Performed at [facility], [address] [place], [place] [address]  I-Stat beta hCG blood, ED ([facility], [facility], [facility] only)     Status: None   Collection Time: 12/15/22  8:24 PM  Result Value Ref Range   I-stat hCG, quantitative <5.0 <5 mIU/mL   Comment 3            Comment:   GEST. AGE      CONC.   (mIU/mL)   <=1 WEEK        5 - 50     2 WEEKS       50 - 500     3 WEEKS       100 - 10,000     4 WEEKS     1,000 - 30,000        FEMALE AND NON-PREGNANT FEMALE:     LESS THAN 5 mIU/mL   B12 and Folate Panel     Status: None   Collection Time: 01/08/23  1:41 PM  Result Value Ref Range   Vitamin B-12 632 232 - 1,245 pg/mL   Folate >20.0 >3.0 ng/mL    Comment: A serum folate concentration of less than 3.1 ng/mL is considered to represent clinical deficiency.   Methylmalonic acid, serum     Status: None   Collection Time: 01/08/23  1:41 PM  Result Value Ref Range   Methylmalonic Acid 97 0 - 378 nmol/L  CBC with Differential/Platelets     Status: Abnormal   Collection Time: 01/08/23  1:41 PM  Result Value Ref Range   WBC 7.7 3.4 - 10.8 x10E3/uL   RBC 5.21 3.77 - 5.28 x10E6/uL   Hemoglobin 11.5 11.1 - 15.9 g/dL   Hematocrit 09.8 11.9 - 46.6 %   MCV 70 (L) 79 - 97 fL   MCH 22.1 (L) 26.6 - 33.0 pg   MCHC 31.6 31.5 - 35.7 g/dL   RDW 14.7 82.9 - 56.2 %   Platelets 300 150 - 450 x10E3/uL   Neutrophils 67 Not Estab. %   Lymphs 26 Not Estab. %   Monocytes 5 Not Estab. %   Eos 2 Not Estab. %   Basos 0 Not Estab. %   Neutrophils Absolute 5.2 1.4 - 7.0 x10E3/uL   Lymphocytes Absolute 2.0 0.7 - 3.1 x10E3/uL   Monocytes Absolute 0.4 0.1 - 0.9 x10E3/uL   EOS (ABSOLUTE) 0.1 0.0 - 0.4 x10E3/uL   Basophils Absolute 0.0 0.0 - 0.2 x10E3/uL   Immature Granulocytes 0 Not Estab. %   Immature Grans (Abs) 0.0 0.0 - 0.1 x10E3/uL  Comprehensive metabolic panel     Status: Abnormal   Collection Time: 01/08/23  1:41 PM  Result Value Ref Range  Glucose 128 (H) 70 - 99 mg/dL   BUN 4 (L) 6 - 24 mg/dL   Creatinine, Ser 4.09 0.57 - 1.00 mg/dL   eGFR 811 >91 YN/WGN/5.62   BUN/Creatinine Ratio 6 (L) 9 - 23   Sodium 139 134 - 144 mmol/L   Potassium 3.9 3.5 - 5.2 mmol/L   Chloride 103 96 - 106 mmol/L   CO2 25 20 - 29 mmol/L   Calcium 9.6 8.7 - 10.2 mg/dL   Total Protein 7.0 6.0 - 8.5 g/dL   Albumin  4.1 3.9 - 4.9 g/dL   Globulin, Total 2.9 1.5 - 4.5 g/dL   Albumin/Globulin Ratio 1.4    Bilirubin Total <0.2 0.0 - 1.2 mg/dL   Alkaline Phosphatase 87 44 - 121 IU/L   AST 15 0 - 40 IU/L   ALT 9 0 - 32 IU/L  TSH Rfx on Abnormal to Free T4     Status: None   Collection Time: 01/08/23  1:45 PM  Result Value Ref Range   TSH 0.838 0.450 - 4.500 uIU/mL    Patient complains of symptoms per HPI as well as the following symptoms: right-sided numbness . Pertinent negatives and positives per HPI. All others negative  HPI 04/2018:  Lorraine West is a 42 y.o. female here as requested by Dr. Nicholos Johns for headaches.  PMHx migraines started in 2011. Mother has migraines. In 2013 she had a migraine for a month, a steroid dosepak helped. Headaches have been on and off. Recently worsening and daily. Cousin died from a brain tumor. She has 20 headache days a month. Migraines start on the right more posterior then travels to behind the eyes, pulsating, movement makes it worse, +light sensitiviy, a dark room helps, +nausea but no vomiting. 10 migraine days a month. Ongoing for 4-6 months at this frequency. Migraines can be moderate to severe. She has woken up with dull headache and worsenes as the day progresses. A migraine can last 1-3 days.  headaches can be positional and worse with bearing down. + blurry vision changes.FHx cancer, mother and grandparents died from cancers and a 1st cousin died of a malignant brain tumor.   meds tried: propranol, tizanidine, tylenol, celebrex, benadryl, ibuprofen, zofran,tizanidine,    Review of Systems: Patient complains of symptoms per HPI as well as the following symptoms: headaches. Pertinent negatives and positives per HPI. 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   Right handed   Lives at home with her children    Currently separated, not legally   Caffeine: 1 cup of coffee/tea daily   Social Determinants of Health   Financial Resource Strain: Not on file  Food Insecurity: Not on file  Transportation Needs: Not on file  Physical Activity: Not on file  Stress: Not on file  Social Connections: Not on file  Intimate Partner  Violence: Not on file    Family History  Problem Relation Age of Onset   Breast cancer Mother 41       d. 33y   Migraines Mother    Diabetes Father    Cervical cancer Maternal Aunt        d. 62y   Colon cancer Maternal Grandmother        dx 82-83y   Heart disease Maternal Grandfather    Heart Problems Maternal Grandfather    Cancer Maternal Grandfather        NOS cancer   Kidney cancer Paternal Grandmother        dx unspecified age   Lung cancer Paternal Grandfather        dx unspecified age   Brain cancer Cousin 55       maternal 1st cousin d. 65y; NOS brain tumor   Breast cancer Cousin 51       maternal 1st cousin   Prostate cancer Cousin 47       maternal 1st cousin d. 25y   Breast cancer Cousin 40    Past Medical History:  Diagnosis Date   Alpha thalassemia (HCC)    Asthma    BRCA gene mutation negative in female    second time- she was tested negative when she was 42 years old   BRCA gene mutation positive in female    1st testing was positive when she was 42 years old   Heart murmur    MVP   Migraine     Past Surgical History:  Procedure Laterality Date   BREAST BIOPSY Right 06/10/2022   [place] RT BREAST BX W LOC DEV 1ST LESION IMG BX SPEC US GUIDE 06/10/2022 [facility]  MAMMOGRAPHY   NO PAST SURGERIES      Current Outpatient Medications  Medication Sig Dispense Refill   dicyclomine (BENTYL) 20 MG tablet Take 1 tablet (20 mg total) by mouth 2 (two) times daily. 20 tablet 0   ibuprofen (ADVIL) 800 MG tablet Take 1 tablet (800 mg total) by mouth every 8 (eight) hours as needed (pain). 21 tablet 0   pantoprazole (PROTONIX) 40 MG tablet Take 1 tablet (40 mg total) by mouth daily. 30 tablet 3   PRESCRIPTION MEDICATION IUD     hydrOXYzine (ATARAX) 25 MG tablet Take 25 mg by mouth 2 (two) times daily as needed. (Patient not taking: Reported on 01/08/2023)     No current facility-administered medications for this visit.    Allergies as of 01/08/2023 - Review Complete 01/08/2023  Allergen Reaction Noted   Gluten meal Nausea Only 10/08/2016    Vitals: BP 111/73   Pulse 90   Ht 4' 11.25" (1.505 m)   Wt 121 lb 6.4 oz (55.1 kg)   BMI 24.31 kg/m  Last Weight:  Wt Readings from Last 1 Encounters:  01/08/23 121 lb 6.4 oz (55.1 kg)   Last Height:   Ht Readings from Last 1 Encounters:  01/08/23 4' 11.25" (1.505 m)   Physical exam: Exam: Gen: NAD, conversant, well nourised, obese, well groomed                     CV: RRR, no MRG. No Carotid Bruits. No peripheral edema, warm, nontender Eyes: Conjunctivae clear without exudates or hemorrhage  Neuro: Detailed Neurologic Exam  Speech:    Speech is normal; fluent and spontaneous with normal comprehension.  Cognition:    The patient is oriented to person, place, and time;  recent and remote memory intact;     language fluent;     normal attention, concentration,     fund of knowledge Cranial Nerves:    The pupils are equal, round, and reactive to light. The fundi are normal and spontaneous venous pulsations are present. Visual fields are full to finger confrontation. Extraocular movements are intact. Trigeminal sensation is intact and the muscles of mastication are normal. The face is symmetric. The  palate elevates in the midline. Hearing intact. Voice is normal. Shoulder shrug is normal. The tongue has normal motion without fasciculations.   Coordination:    Normal finger to nose and heel to shin. Normal rapid alternating movements.   Gait:    Heel-toe and tandem gait are normal.   Motor Observation:    No asymmetry, no atrophy, and no involuntary movements noted. Tone:    Normal muscle tone.    Posture:    Posture is normal. normal erect    Strength: right-sided arm and leg weakness 4/5 proximal > distal otherwise  Strength is V/V in the upper and lower limbs.      Sensation: intact to LT     Reflex Exam:  DTR's:    Deep tendon reflexes in the upper and lower extremities are asymmetrical  bilaterally slightly brisker on the right Toes:    The toes are downgoing bilaterally.   Clonus:    Clonus is absent.  Assessment/Plan:  42 year old with persistent acute right-sided weakness. MRIs an miss up to 15% of small strokes need to re-evaluate for stroke and also take a look at the cervical spine for myelopathy or other lesion in the cord whether demyelinating or other. MRI was not completed with MS protocol and I do see several T2 hyperintensities we need to evaluate for MS, stroke, cerebrovascular disease (see below),myelopathies or TM or spinal thrombosis,   There is also a chronic hypercoagulable state in alpha thalassemia and  thromboembolic complications in thalassemia, cerebral thrombotic events, An [place] multicenter study of 735 patients with ?-TM reported 16 individuals with cerebral thromboembolic events accompanied by a clinical picture of headache, seizures, and hemiparesis, pulmonary HTN, TIA, heart failure, recanalized thrombi in the pulmonary arteries and microvasculature,cerebrovascular disease ( American society of hematology: The hypercoagulable state in thalassemia  Clinical Trials & Observations Amiram Eldor, Eliezer A. Rachmilewitz) CTV of the head B12  again Emg/ncsMri cervical spine w/wo contrast MRI brain w/wo contrast - MS protocol and stroke protocol  Addendum: [facility] for mucoud cyst in left maxillary per patient request   Orders Placed This Encounter  Procedures   MR CERVICAL SPINE W WO CONTRAST   MR BRAIN W WO CONTRAST   CT VENOGRAM HEAD   B12 and Folate Panel   Methylmalonic acid, serum   CBC with Differential/Platelets   Comprehensive metabolic panel   TSH Rfx on Abnormal to Free T4   Ambulatory referral to ENT   NCV with EMG(electromyography)    Cc: Georgianne Fick, MD  Discussed: To prevent or relieve headaches, try the following: Cool Compress. Lie down and place a cool compress on your head.  Avoid headache triggers. If certain foods or odors seem to have triggered your migraines in the past, avoid them. A headache diary might help you identify triggers.  Include physical activity in your daily routine. Try a daily walk or other moderate aerobic exercise.  Manage stress. Find healthy ways to cope with the stressors, such as delegating tasks on your to-do list.  Practice relaxation  techniques. Try deep breathing, yoga, massage and visualization.  Eat regularly. Eating regularly scheduled meals and maintaining a healthy diet might help prevent headaches. Also, drink plenty of fluids.  Follow a regular sleep schedule. Sleep deprivation might contribute to headaches Consider biofeedback. With this mind-body technique, you learn to control certain bodily functions -- such as muscle tension, heart rate and blood pressure -- to prevent headaches or reduce headache pain.    Proceed to emergency room if you experience new or worsening symptoms or symptoms do not resolve, if you have new neurologic symptoms or if headache is severe, or for any concerning symptom.   Provided education and documentation from American headache Society toolbox including articles on: chronic migraine medication overuse headache, chronic  migraines, prevention of migraines, behavioral and other nonpharmacologic treatments for headache.  A total of 60 minutes was spent face-to-face with this patient. Over half this time was spent on counseling patient on the  1. Right sided numbness   2. screen for B12 deficiency   3. Intracranial and intraspinal phlebitis and thrombophlebitis   4. Right hemiparesis (HCC)   5. Imbalance   6. Gait abnormality   7. Ataxia   8. Alpha thalassemia (HCC)   9. Mucous retention cyst of maxillary sinus     diagnosis and different diagnostic and therapeutic options, counseling and coordination of care, risks ans benefits of management, compliance, or risk factor reduction and education.     Naomie Dean, MD  [facility] [address] [place], [place] [address]  Phone [phone] Fax [phone]

## 2023-01-08 NOTE — Patient Instructions (Addendum)
Mri cervical spine w/wo contrast MRI brain w/wo contrast - MS protocol and stroke protocol CTV of the head B12 again Emg/ncs   Electromyoneurogram Electromyoneurogram is a test to check how well your muscles and nerves are working. This procedure includes the combined use of electromyogram (EMG) and nerve conduction study (NCS). EMG is used to evaluate muscles and the nerves that control those muscles. NCS, which is also called electroneurogram, measures how well your nerves conduct electricity. The procedures should be done together to check if your muscles and nerves are healthy. If the results of the tests are abnormal, this may indicate disease or injury, such as a neuromuscular disease or peripheral nerve damage. Tell a health care provider about: Any allergies you have. All medicines you are taking, including vitamins, herbs, eye drops, creams, and over-the-counter medicines. Any bleeding problems you have. Any surgeries you have had. Any medical conditions you have. What are the risks? Generally, this is a safe procedure. However, problems may occur, including: Bleeding or bruising. Infection where the electrodes were inserted. What happens before the test? Medicines Take all of your usually prescribed medications before this testing is performed. Do not stop your blood thinners unless advised by your prescribing physician. General instructions Your health care provider may ask you to warm the limb that will be checked with warm water, hot pack, or wrapping the limb in a blanket. Do not use lotions or creams on the same day that you will be having the procedure. What happens during the test? For EMG  Your health care provider will ask you to stay in a position so that the muscle being studied can be accessed. You will be sitting or lying down. You may be given a medicine to numb the area (local anesthetic) and the skin will be disinfected. A very thin needle that has an  electrode will be inserted into your muscle, one muscle at a time. Typically, multiple muscles are evaluated during a single study. Another small electrode will be placed on your skin near the muscle. Your health care provider will ask you to continue to remain still. The electrodes will record the electrical activity of your muscles. You may see this on a monitor or hear it in the room. After your muscles have been studied at rest, your health care provider will ask you to contract or flex your muscles. The electrodes will record the electrical activity of your muscles. Your health care provider will remove the electrodes and the electrode needle when the procedure is finished. The procedure may vary among health care providers and hospitals. For NCS  An electrode that records your nerve activity (recording electrode) will be placed on your skin by the muscle that is being studied. An electrode that is used as a reference (reference electrode) will be placed near the recording electrode. A paste or gel will be applied to your skin between the recording electrode and the reference electrode. Your nerve will be stimulated with a mild shock. The speed of the nerves and strength of response is recorded by the electrodes. Your health care provider will remove the electrodes and the gel when the procedure is finished. The procedure may vary among health care providers and hospitals. What can I expect after the test? It is up to you to get your test results. Ask your health care provider, or the department that is doing the test, when your results will be ready. Your health care provider may: Give you medicines for any pain.  Monitor the insertion sites to make sure that bleeding stops. You should be able to drive yourself to and from the test. Discomfort can persist for a few hours after the test, but should be better the next day. Contact a health care provider if: You have swelling, redness, or  drainage at any of the insertion sites. Summary Electromyoneurogram is a test to check how well your muscles and nerves are working. If the results of the tests are abnormal, this may indicate disease or injury. This is a safe procedure. However, problems may occur, such as bleeding and infection. Your health care provider will do two tests to complete this procedure. One checks your muscles (EMG) and another checks your nerves (NCS). It is up to you to get your test results. Ask your health care provider, or the department that is doing the test, when your results will be ready. This information is not intended to replace advice given to you by your health care provider. Make sure you discuss any questions you have with your health care provider. Document Revised: 03/27/2021 Document Reviewed: 02/24/2021 Elsevier Patient Education  2024 [employer].   .

## 2023-01-09 LAB — CBC WITH DIFFERENTIAL/PLATELET
Lymphocytes Absolute: 2 10*3/uL (ref 0.7–3.1)
Lymphs: 26 %

## 2023-01-09 LAB — COMPREHENSIVE METABOLIC PANEL
AST: 15 IU/L (ref 0–40)
BUN/Creatinine Ratio: 6 — ABNORMAL LOW (ref 9–23)
BUN: 4 mg/dL — ABNORMAL LOW (ref 6–24)
Chloride: 103 mmol/L (ref 96–106)
Globulin, Total: 2.9 g/dL (ref 1.5–4.5)

## 2023-01-09 LAB — TSH RFX ON ABNORMAL TO FREE T4: TSH: 0.838 u[IU]/mL (ref 0.450–4.500)

## 2023-01-09 LAB — B12 AND FOLATE PANEL: Folate: >20 ng/mL (ref >3.0)

## 2023-01-11 LAB — CBC WITH DIFFERENTIAL/PLATELET
Basophils Absolute: 0 10*3/uL (ref 0.0–0.2)
Basos: 0 %
EOS (ABSOLUTE): 0.1 10*3/uL (ref 0.0–0.4)
Eos: 2 %
Hematocrit: 36.4 % (ref 34.0–46.6)
Hemoglobin: 11.5 g/dL (ref 11.1–15.9)
Immature Grans (Abs): 0 10*3/uL (ref 0.0–0.1)
Immature Granulocytes: 0 %
MCH: 22.1 pg — ABNORMAL LOW (ref 26.6–33.0)
MCHC: 31.6 g/dL (ref 31.5–35.7)
MCV: 70 fL — ABNORMAL LOW (ref 79–97)
Monocytes Absolute: 0.4 10*3/uL (ref 0.1–0.9)
Monocytes: 5 %
Neutrophils Absolute: 5.2 10*3/uL (ref 1.4–7.0)
Neutrophils: 67 %
Platelets: 300 10*3/uL (ref 150–450)
RBC: 5.21 x10E6/uL (ref 3.77–5.28)
RDW: 13.5 % (ref 11.7–15.4)
WBC: 7.7 10*3/uL (ref 3.4–10.8)

## 2023-01-11 LAB — COMPREHENSIVE METABOLIC PANEL
ALT: 9 IU/L (ref 0–32)
Albumin/Globulin Ratio: 1.4
Albumin: 4.1 g/dL (ref 3.9–4.9)
Alkaline Phosphatase: 87 IU/L (ref 44–121)
Bilirubin Total: <0.2 mg/dL (ref 0.0–1.2)
CO2: 25 mmol/L (ref 20–29)
Calcium: 9.6 mg/dL (ref 8.7–10.2)
Creatinine, Ser: 0.65 mg/dL (ref 0.57–1.00)
Glucose: 128 mg/dL — ABNORMAL HIGH (ref 70–99)
Potassium: 3.9 mmol/L (ref 3.5–5.2)
Sodium: 139 mmol/L (ref 134–144)
Total Protein: 7 g/dL (ref 6.0–8.5)
eGFR: 114 mL/min/{1.73_m2} (ref >59)

## 2023-01-11 LAB — METHYLMALONIC ACID, SERUM: Methylmalonic Acid: 97 nmol/L (ref 0–378)

## 2023-01-11 LAB — B12 AND FOLATE PANEL: Vitamin B-12: 632 pg/mL (ref 232–1245)

## 2023-01-15 ENCOUNTER — Telehealth: Payer: Self-pay | Admitting: Neurology

## 2023-01-15 NOTE — Telephone Encounter (Signed)
[facility]: [id number] exp. 01/13/23-04/13/23 sent to GI [phone]

## 2023-01-21 ENCOUNTER — Ambulatory Visit (INDEPENDENT_AMBULATORY_CARE_PROVIDER_SITE_OTHER): Payer: Commercial Managed Care - PPO

## 2023-01-21 DIAGNOSIS — R269 Unspecified abnormalities of gait and mobility: Secondary | ICD-10-CM

## 2023-01-21 DIAGNOSIS — G08 Intracranial and intraspinal phlebitis and thrombophlebitis: Secondary | ICD-10-CM | POA: Diagnosis not present

## 2023-01-21 DIAGNOSIS — R2689 Other abnormalities of gait and mobility: Secondary | ICD-10-CM | POA: Diagnosis not present

## 2023-01-21 DIAGNOSIS — G8191 Hemiplegia, unspecified affecting right dominant side: Secondary | ICD-10-CM | POA: Diagnosis not present

## 2023-01-21 DIAGNOSIS — R270 Ataxia, unspecified: Secondary | ICD-10-CM

## 2023-01-21 DIAGNOSIS — R200 Anesthesia of skin: Secondary | ICD-10-CM | POA: Diagnosis not present

## 2023-01-21 DIAGNOSIS — D560 Alpha thalassemia: Secondary | ICD-10-CM

## 2023-01-21 MED ORDER — GADOBENATE DIMEGLUMINE 529 MG/ML IV SOLN
10.0000 mL | Freq: Once | INTRAVENOUS | Status: AC | PRN
Start: 2023-01-21 — End: 2023-01-21
  Administered 2023-01-21: 10 mL via INTRAVENOUS

## 2023-01-22 ENCOUNTER — Encounter: Payer: Self-pay | Admitting: Neurology

## 2023-01-26 ENCOUNTER — Ambulatory Visit
Admission: RE | Admit: 2023-01-26 | Discharge: 2023-01-26 | Disposition: A | Discharge status: Home / Self Care | Payer: Commercial Managed Care - PPO | Source: Ambulatory Visit | Attending: Neurology | Admitting: Neurology

## 2023-01-26 ENCOUNTER — Other Ambulatory Visit: Payer: Commercial Managed Care - PPO

## 2023-01-26 DIAGNOSIS — G8191 Hemiplegia, unspecified affecting right dominant side: Secondary | ICD-10-CM

## 2023-01-26 DIAGNOSIS — G08 Intracranial and intraspinal phlebitis and thrombophlebitis: Secondary | ICD-10-CM

## 2023-01-26 DIAGNOSIS — R200 Anesthesia of skin: Secondary | ICD-10-CM

## 2023-01-26 DIAGNOSIS — R270 Ataxia, unspecified: Secondary | ICD-10-CM

## 2023-01-26 DIAGNOSIS — R269 Unspecified abnormalities of gait and mobility: Secondary | ICD-10-CM

## 2023-01-26 DIAGNOSIS — D560 Alpha thalassemia: Secondary | ICD-10-CM

## 2023-01-26 DIAGNOSIS — R2689 Other abnormalities of gait and mobility: Secondary | ICD-10-CM

## 2023-01-26 MED ORDER — IOPAMIDOL (ISOVUE-370) INJECTION 76%
75.0000 mL | Freq: Once | INTRAVENOUS | Status: AC | PRN
  Administered 2023-01-26: 75 mL via INTRAVENOUS

## 2023-01-27 ENCOUNTER — Encounter: Payer: Self-pay | Admitting: Nurse Practitioner

## 2023-01-28 ENCOUNTER — Ambulatory Visit
Admission: RE | Admit: 2023-01-28 | Discharge: 2023-01-28 | Disposition: A | Discharge status: Home / Self Care | Payer: Commercial Managed Care - PPO | Source: Ambulatory Visit | Attending: Nurse Practitioner | Admitting: Nurse Practitioner

## 2023-01-28 DIAGNOSIS — Z809 Family history of malignant neoplasm, unspecified: Secondary | ICD-10-CM

## 2023-01-28 DIAGNOSIS — Z803 Family history of malignant neoplasm of breast: Secondary | ICD-10-CM

## 2023-01-28 MED ORDER — GADOPICLENOL 0.5 MMOL/ML IV SOLN
5.0000 mL | Freq: Once | INTRAVENOUS | Status: AC | PRN
  Administered 2023-01-28: 5 mL via INTRAVENOUS

## 2023-01-30 ENCOUNTER — Other Ambulatory Visit: Payer: Self-pay | Admitting: Nurse Practitioner

## 2023-01-30 DIAGNOSIS — R928 Other abnormal and inconclusive findings on diagnostic imaging of breast: Secondary | ICD-10-CM

## 2023-01-30 DIAGNOSIS — N644 Mastodynia: Secondary | ICD-10-CM

## 2023-01-30 DIAGNOSIS — N6320 Unspecified lump in the left breast, unspecified quadrant: Secondary | ICD-10-CM

## 2023-02-02 NOTE — Addendum Note (Signed)
Addended by: Naomie Dean B on: 02/02/2023 03:27 PM   Modules accepted: Orders

## 2023-02-03 ENCOUNTER — Telehealth: Payer: Self-pay | Admitting: Neurology

## 2023-02-03 NOTE — Telephone Encounter (Signed)
Referral faxed to [facility]: Phone: [phone]   Fax: [phone]

## 2023-02-06 ENCOUNTER — Other Ambulatory Visit: Payer: Self-pay | Admitting: Internal Medicine

## 2023-02-06 DIAGNOSIS — R1031 Right lower quadrant pain: Secondary | ICD-10-CM

## 2023-02-10 ENCOUNTER — Ambulatory Visit: Payer: Commercial Managed Care - PPO

## 2023-02-10 ENCOUNTER — Ambulatory Visit
Admission: RE | Admit: 2023-02-10 | Discharge: 2023-02-10 | Disposition: A | Discharge status: Home / Self Care | Payer: Commercial Managed Care - PPO | Source: Ambulatory Visit | Attending: Nurse Practitioner | Admitting: Nurse Practitioner

## 2023-02-10 DIAGNOSIS — R928 Other abnormal and inconclusive findings on diagnostic imaging of breast: Secondary | ICD-10-CM

## 2023-02-10 DIAGNOSIS — N6320 Unspecified lump in the left breast, unspecified quadrant: Secondary | ICD-10-CM

## 2023-02-10 DIAGNOSIS — N644 Mastodynia: Secondary | ICD-10-CM

## 2023-02-11 ENCOUNTER — Encounter: Payer: Self-pay | Admitting: Internal Medicine

## 2023-02-11 ENCOUNTER — Other Ambulatory Visit: Payer: Commercial Managed Care - PPO

## 2023-02-16 ENCOUNTER — Ambulatory Visit
Admission: RE | Admit: 2023-02-16 | Discharge: 2023-02-16 | Disposition: A | Discharge status: Home / Self Care | Payer: Commercial Managed Care - PPO | Source: Ambulatory Visit | Attending: Internal Medicine | Admitting: Internal Medicine

## 2023-02-16 DIAGNOSIS — R1031 Right lower quadrant pain: Secondary | ICD-10-CM

## 2023-02-16 MED ORDER — IOPAMIDOL (ISOVUE-300) INJECTION 61%
80.0000 mL | Freq: Once | INTRAVENOUS | Status: AC | PRN
  Administered 2023-02-16: 80 mL via INTRAVENOUS

## 2023-03-23 ENCOUNTER — Ambulatory Visit (INDEPENDENT_AMBULATORY_CARE_PROVIDER_SITE_OTHER): Payer: Commercial Managed Care - PPO | Admitting: Neurology

## 2023-03-23 ENCOUNTER — Ambulatory Visit (INDEPENDENT_AMBULATORY_CARE_PROVIDER_SITE_OTHER): Payer: Self-pay | Admitting: Neurology

## 2023-03-23 DIAGNOSIS — R200 Anesthesia of skin: Secondary | ICD-10-CM

## 2023-03-23 DIAGNOSIS — Z0289 Encounter for other administrative examinations: Secondary | ICD-10-CM

## 2023-03-23 DIAGNOSIS — E538 Deficiency of other specified B group vitamins: Secondary | ICD-10-CM

## 2023-03-24 ENCOUNTER — Encounter: Payer: Self-pay | Admitting: Neurology

## 2023-03-24 NOTE — Progress Notes (Signed)
Full Name: Lorraine West Gender: Female MRN #: [id number] Date of Birth: Oct 11, 1980    Visit Date: 03/23/2023 12:30 Age: 42 Years Examining Physician: Dr. Naomie Dean Referring Physician: Dr. Naomie Dean Height: 4 feet 11 inch    History: Right-sided weakness  Summary: nerve conduction studies and EMG performed on the right upper and right lower extremities. All nerves and muscles (as indicated in the following tables) were within normal limits.     Conclusion: This is a normal study of the right upper and right lower extremities. No evidence for mononeuropathy, large-fiber polyneuropathy, radiculopathy or neurogenic muscle disease.   ------------------------------- Naomie Dean, MD  [facility] [address] [place], [place] [address] Tel: [phone] Fax: [phone]  Verbal informed consent was obtained from the patient, patient was informed of potential risk of procedure, including bruising, bleeding, hematoma formation, infection, muscle weakness, muscle pain, numbness, among others.        MNC    Nerve / Sites Muscle Latency Ref. Amplitude Ref. Rel Amp Segments Distance Velocity Ref. Area    ms ms mV mV %  cm m/s m/s mVms  R Median - APB     Wrist APB 3.0 ?4.4 13.7 ?4.0 100 Wrist - APB 7   62.3     Upper arm APB 7.0  13.3  97.4 Upper arm - Wrist 23.6 60 ?49 56.7  R Ulnar - ADM     Wrist ADM 2.2 ?3.3 12.6 ?6.0 100 Wrist - ADM 7   35.0     B.Elbow ADM 3.9  12.0  95 B.Elbow - Wrist 11 66 ?49 34.0     A.Elbow ADM 6.1  11.5  95.9 A.Elbow - B.Elbow 15.6 69 ?49 33.4  R Peroneal - EDB     Ankle EDB 3.4 ?6.5 6.1 ?2.0 100 Ankle - EDB 9   18.1     Fib head EDB 8.5  5.8  94.3 Fib head - Ankle 24 48 ?44 17.4     Pop fossa EDB 10.2  5.9  102 Pop fossa - Fib head 10 56 ?44 18.9         Pop fossa - Ankle      R Tibial - AH     Ankle AH 3.5 ?5.8 7.2 ?4.0 100 Ankle - AH 9   18.8     Pop fossa AH 9.8  7.3  101 Pop fossa - Ankle 35 56 ?41  22.1             SNC    Nerve / Sites Rec. Site Peak Lat Ref.  Amp Ref. Segments Distance Peak Diff Ref.    ms ms V V  cm ms ms  R Sural - Ankle (Calf)     Calf Ankle 3.3 ?4.4 12 ?6 Calf - Ankle 14    R Superficial peroneal - Ankle     Lat leg Ankle 3.2 ?4.4 12 ?6 Lat leg - Ankle 14    R Median, Ulnar - Transcarpal comparison     Median Palm Wrist 1.9 ?2.2 41 ?35 Median Palm - Wrist 8       Ulnar Palm Wrist 1.9 ?2.2 34 ?12 Ulnar Palm - Wrist 8          Median Palm - Ulnar Palm  0.0 ?0.4  R Median - Orthodromic (Dig II, Mid palm)     Dig II Wrist 2.8 ?3.4 13 ?10 Dig II - Wrist 13  R Ulnar - Orthodromic, (Dig V, Mid palm)     Dig V Wrist 2.6 ?3.1 23 ?5 Dig V - Wrist 70                 F  Wave    Nerve F Lat Ref.   ms ms  R Tibial - AH 41.4 ?56.0  R Ulnar - ADM 22.6 ?32.0         EMG Summary Table    Spontaneous MUAP Recruitment  Muscle IA Fib PSW Fasc Other Amp Dur. Poly Pattern  R. Vastus medialis Normal None None None _______ Normal Normal Normal Normal  R. Iliopsoas Normal None None None _______ Normal Normal Normal Normal  R. Adductor longus Normal None None None _______ Normal Normal Normal Normal  R. Tibialis anterior Normal None None None _______ Normal Normal Normal Normal  R. Gastrocnemius (Medial head) Normal None None None _______ Normal Normal Normal Normal  R. Extensor hallucis longus Normal None None None _______ Normal Normal Normal Normal  R. Biceps femoris (long head) Normal None None None _______ Normal Normal Normal Normal  R. Gluteus maximus Normal None None None _______ Normal Normal Normal Normal  R. Gluteus medius Normal None None None _______ Normal Normal Normal Normal  R. Lumbar paraspinals Normal None None None _______ Normal Normal Normal Normal  R. Cervical paraspinals (low) Normal None None None _______ Normal Normal Normal Normal  R. Deltoid Normal None None None _______ Normal Normal Normal Normal  R. Triceps brachii Normal None None None  _______ Normal Normal Normal Normal  R. Biceps brachii Normal None None None _______ Normal Normal Normal Normal  R. Pronator teres Normal None None None _______ Normal Normal Normal Normal  R. First dorsal interosseous Normal None None None _______ Normal Normal Normal Normal  R. Opponens pollicis Normal None None None _______ Normal Normal Normal Normal  R. Extensor indicis proprius Normal None None None _______ Normal Normal Normal Normal

## 2023-03-24 NOTE — Procedures (Signed)
Full Name: Lorraine West Gender: Female MRN #: [id number] Date of Birth: Oct 11, 1980    Visit Date: 03/23/2023 12:30 Age: 42 Years Examining Physician: Dr. Naomie Dean Referring Physician: Dr. Naomie Dean Height: 4 feet 11 inch    History: Right-sided weakness  Summary: nerve conduction studies and EMG performed on the right upper and right lower extremities. All nerves and muscles (as indicated in the following tables) were within normal limits.     Conclusion: This is a normal study of the right upper and right lower extremities. No evidence for mononeuropathy, large-fiber polyneuropathy, radiculopathy or neurogenic muscle disease.   ------------------------------- Naomie Dean, MD  [facility] [address] [place], [place] [address] Tel: [phone] Fax: [phone]  Verbal informed consent was obtained from the patient, patient was informed of potential risk of procedure, including bruising, bleeding, hematoma formation, infection, muscle weakness, muscle pain, numbness, among others.        MNC    Nerve / Sites Muscle Latency Ref. Amplitude Ref. Rel Amp Segments Distance Velocity Ref. Area    ms ms mV mV %  cm m/s m/s mVms  R Median - APB     Wrist APB 3.0 ?4.4 13.7 ?4.0 100 Wrist - APB 7   62.3     Upper arm APB 7.0  13.3  97.4 Upper arm - Wrist 23.6 60 ?49 56.7  R Ulnar - ADM     Wrist ADM 2.2 ?3.3 12.6 ?6.0 100 Wrist - ADM 7   35.0     B.Elbow ADM 3.9  12.0  95 B.Elbow - Wrist 11 66 ?49 34.0     A.Elbow ADM 6.1  11.5  95.9 A.Elbow - B.Elbow 15.6 69 ?49 33.4  R Peroneal - EDB     Ankle EDB 3.4 ?6.5 6.1 ?2.0 100 Ankle - EDB 9   18.1     Fib head EDB 8.5  5.8  94.3 Fib head - Ankle 24 48 ?44 17.4     Pop fossa EDB 10.2  5.9  102 Pop fossa - Fib head 10 56 ?44 18.9         Pop fossa - Ankle      R Tibial - AH     Ankle AH 3.5 ?5.8 7.2 ?4.0 100 Ankle - AH 9   18.8     Pop fossa AH 9.8  7.3  101 Pop fossa - Ankle 35 56 ?41  22.1             SNC    Nerve / Sites Rec. Site Peak Lat Ref.  Amp Ref. Segments Distance Peak Diff Ref.    ms ms V V  cm ms ms  R Sural - Ankle (Calf)     Calf Ankle 3.3 ?4.4 12 ?6 Calf - Ankle 14    R Superficial peroneal - Ankle     Lat leg Ankle 3.2 ?4.4 12 ?6 Lat leg - Ankle 14    R Median, Ulnar - Transcarpal comparison     Median Palm Wrist 1.9 ?2.2 41 ?35 Median Palm - Wrist 8       Ulnar Palm Wrist 1.9 ?2.2 34 ?12 Ulnar Palm - Wrist 8          Median Palm - Ulnar Palm  0.0 ?0.4  R Median - Orthodromic (Dig II, Mid palm)     Dig II Wrist 2.8 ?3.4 13 ?10 Dig II - Wrist 13  R Ulnar - Orthodromic, (Dig V, Mid palm)     Dig V Wrist 2.6 ?3.1 23 ?5 Dig V - Wrist 70                 F  Wave    Nerve F Lat Ref.   ms ms  R Tibial - AH 41.4 ?56.0  R Ulnar - ADM 22.6 ?32.0         EMG Summary Table    Spontaneous MUAP Recruitment  Muscle IA Fib PSW Fasc Other Amp Dur. Poly Pattern  R. Vastus medialis Normal None None None _______ Normal Normal Normal Normal  R. Iliopsoas Normal None None None _______ Normal Normal Normal Normal  R. Adductor longus Normal None None None _______ Normal Normal Normal Normal  R. Tibialis anterior Normal None None None _______ Normal Normal Normal Normal  R. Gastrocnemius (Medial head) Normal None None None _______ Normal Normal Normal Normal  R. Extensor hallucis longus Normal None None None _______ Normal Normal Normal Normal  R. Biceps femoris (long head) Normal None None None _______ Normal Normal Normal Normal  R. Gluteus maximus Normal None None None _______ Normal Normal Normal Normal  R. Gluteus medius Normal None None None _______ Normal Normal Normal Normal  R. Lumbar paraspinals Normal None None None _______ Normal Normal Normal Normal  R. Cervical paraspinals (low) Normal None None None _______ Normal Normal Normal Normal  R. Deltoid Normal None None None _______ Normal Normal Normal Normal  R. Triceps brachii Normal None None None  _______ Normal Normal Normal Normal  R. Biceps brachii Normal None None None _______ Normal Normal Normal Normal  R. Pronator teres Normal None None None _______ Normal Normal Normal Normal  R. First dorsal interosseous Normal None None None _______ Normal Normal Normal Normal  R. Opponens pollicis Normal None None None _______ Normal Normal Normal Normal  R. Extensor indicis proprius Normal None None None _______ Normal Normal Normal Normal

## 2023-03-30 ENCOUNTER — Ambulatory Visit
Admission: RE | Admit: 2023-03-30 | Discharge: 2023-03-30 | Disposition: A | Discharge status: Home / Self Care | Payer: Commercial Managed Care - PPO | Source: Ambulatory Visit | Attending: Internal Medicine | Admitting: Internal Medicine

## 2023-03-30 VITALS — BP 107/73 | HR 111 | Temp 98.9°F | Resp 18

## 2023-03-30 DIAGNOSIS — J029 Acute pharyngitis, unspecified: Secondary | ICD-10-CM

## 2023-03-30 LAB — POCT RAPID STREP A (OFFICE): Rapid Strep A Screen: NEGATIVE

## 2023-03-30 NOTE — ED Provider Notes (Signed)
[facility]    CSN: [id number] Arrival date & time: 03/30/23  1443      History   Chief Complaint Chief Complaint  Patient presents with   Sore Throat    Entered by patient    HPI Lorraine West is a 42 y.o. female.   Patient presents with sore throat that started about 4 days ago.  Reports very mild nonproductive cough as well.  Denies any upper respiratory symptoms, fever, known sick contacts.  Patient has taken TheraFlu and Alka-Seltzer.    Sore Throat    Past Medical History:  Diagnosis Date   Alpha thalassemia (HCC)    Asthma    BRCA gene mutation negative in female    second time- she was tested negative when she was 42 years old   BRCA gene mutation positive in female    1st testing was positive when she was 42 years old   Heart murmur    MVP   Migraine     Patient Active Problem List   Diagnosis Date Noted   Chronic migraine without aura without status migrainosus, not intractable 05/19/2018   Genetic testing 07/14/2016   Family history of breast cancer in female 06/18/2016   Vaginal delivery 03/08/2016   Second-degree perineal laceration, with delivery 03/08/2016   Alpha thalassemia (HCC) 03/08/2016    Past Surgical History:  Procedure Laterality Date   BREAST BIOPSY Right 06/10/2022   [place] RT BREAST BX W LOC DEV 1ST LESION IMG BX SPEC US GUIDE 06/10/2022 [facility]   NO PAST SURGERIES      OB History     Gravida  3   Para  3   Term  3   Preterm  0   AB  0   Living  3      SAB  0   IAB  0   Ectopic  0   Multiple  0   Live Births  3            Home Medications    Prior to Admission medications   Medication Sig Start Date End Date Taking? Authorizing Provider  dicyclomine (BENTYL) 20 MG tablet Take 1 tablet (20 mg total) by mouth 2 (two) times daily. 12/16/22   Sabas Sous, MD  hydrOXYzine (ATARAX) 25 MG tablet Take 25 mg by mouth 2 (two) times daily as needed. Patient not taking: Reported on  01/08/2023 12/01/22   [provider]  ibuprofen (ADVIL) 800 MG tablet Take 1 tablet (800 mg total) by mouth every 8 (eight) hours as needed (pain). 11/21/22   Zenia Resides, MD  pantoprazole (PROTONIX) 40 MG tablet Take 1 tablet (40 mg total) by mouth daily. 02/26/21     PRESCRIPTION MEDICATION IUD    [provider]    Family History Family History  Problem Relation Age of Onset   Breast cancer Mother 66       d. 36y   Migraines Mother    Diabetes Father    Cervical cancer Maternal Aunt        d. 62y   Colon cancer Maternal Grandmother        dx 82-83y   Heart disease Maternal Grandfather    Heart Problems Maternal Grandfather    Cancer Maternal Grandfather        NOS cancer   Kidney cancer Paternal Grandmother        dx unspecified age   Lung cancer Paternal Grandfather  dx unspecified age   Brain cancer Cousin 73       maternal 1st cousin d. 52y; NOS brain tumor   Breast cancer Cousin 21       maternal 1st cousin   Prostate cancer Cousin 6       maternal 1st cousin d. 25y   Breast cancer Cousin 22    Social History Social History   Tobacco Use   Smoking status: Never   Smokeless tobacco: Never  Vaping Use   Vaping status: Never Used  Substance Use Topics   Alcohol use: No   Drug use: Never     Allergies   Gluten meal   Review of Systems Review of Systems Per HPI  Physical Exam Triage Vital Signs ED Triage Vitals [03/30/23 1536]  Encounter Vitals Group     BP 107/73     Systolic BP Percentile      Diastolic BP Percentile      Pulse Rate (!) 111     Resp 18     Temp 98.9 F (37.2 C)     Temp Source Oral     SpO2 96 %     Weight      Height      Head Circumference      Peak Flow      Pain Score      Pain Loc      Pain Education      Exclude from Growth Chart    No data found.  Updated Vital Signs BP 107/73 (BP Location: Left Arm)   Pulse (!) 111   Temp 98.9 F (37.2 C) (Oral)   Resp 18   SpO2 96%   Visual  Acuity Right Eye Distance:   Left Eye Distance:   Bilateral Distance:    Right Eye Near:   Left Eye Near:    Bilateral Near:     Physical Exam Constitutional:      General: She is not in acute distress.    Appearance: Normal appearance. She is not toxic-appearing or diaphoretic.  HENT:     Head: Normocephalic and atraumatic.     Right Ear: Tympanic membrane and ear canal normal.     Left Ear: Tympanic membrane and ear canal normal.     Nose: No congestion.     Mouth/Throat:     Mouth: Mucous membranes are moist.     Pharynx: Posterior oropharyngeal erythema present.     Comments: Very mild erythema, if any.  Eyes:     Extraocular Movements: Extraocular movements intact.     Conjunctiva/sclera: Conjunctivae normal.     Pupils: Pupils are equal, round, and reactive to light.  Cardiovascular:     Rate and Rhythm: Normal rate and regular rhythm.     Pulses: Normal pulses.     Heart sounds: Normal heart sounds.  Pulmonary:     Effort: Pulmonary effort is normal. No respiratory distress.     Breath sounds: Normal breath sounds. No wheezing.  Abdominal:     General: Abdomen is flat. Bowel sounds are normal.     Palpations: Abdomen is soft.  Musculoskeletal:        General: Normal range of motion.     Cervical back: Normal range of motion.  Skin:    General: Skin is warm and dry.  Neurological:     General: No focal deficit present.     Mental Status: She is alert and oriented to person, place, and time. Mental status  is at baseline.  Psychiatric:        Mood and Affect: Mood normal.        Behavior: Behavior normal.      UC Treatments / Results  Labs (all labs ordered are listed, but only abnormal results are displayed) Labs Reviewed  CULTURE, GROUP A STREP [facility])  POCT RAPID STREP A (OFFICE)    EKG   Radiology No results found.  Procedures Procedures (including critical care time)  Medications Ordered in UC Medications - No data to display  Initial  Impression / Assessment and Plan / UC Course  I have reviewed the triage vital signs and the nursing notes.  Pertinent labs & imaging results that were available during my care of the patient were reviewed by me and considered in my medical decision making (see chart for details).     Differential diagnoses include viral pharyngitis versus reflux related symptoms.  Rapid strep is negative.  Throat culture pending.  Patient declined COVID testing.  Patient reports that she was previously on pantoprazole for GERD but was taken off of it by GI specially for "testing".  Discussed following up with her GI specialist as soon as possible to discuss restarting this medication.  Otherwise, advised supportive care, adequate fluids, rest were discussed with patient given that it could be viral in nature.  Patient verbalized understanding and was agreeable with plan. Final Clinical Impressions(s) / UC Diagnoses   Final diagnoses:  Acute pharyngitis, unspecified etiology     Discharge Instructions      Strep was negative.  Throat culture pending.  Will call if it is positive.  Please follow-up with your GI doctor to see if they want you to start pantoprazole again.    ED Prescriptions   None    PDMP not reviewed this encounter.   Gustavus Bryant, [place] 03/30/23 [phone]

## 2023-03-30 NOTE — ED Triage Notes (Signed)
Pt reports sore throat x 3 days. No other symptoms.

## 2023-03-30 NOTE — Discharge Instructions (Signed)
Strep was negative.  Throat culture pending.  Will call if it is positive.  Please follow-up with your GI doctor to see if they want you to start pantoprazole again.

## 2023-04-02 ENCOUNTER — Other Ambulatory Visit (HOSPITAL_COMMUNITY): Payer: Self-pay

## 2023-04-02 ENCOUNTER — Other Ambulatory Visit (HOSPITAL_BASED_OUTPATIENT_CLINIC_OR_DEPARTMENT_OTHER): Payer: Self-pay

## 2023-04-02 MED ORDER — MESALAMINE ER 500 MG PO CPCR
1000.0000 mg | ORAL_CAPSULE | Freq: Three times a day (TID) | ORAL | 3 refills | Status: DC
  Filled 2023-04-02: qty 180, 30d supply, fill #0
  Filled 2023-04-29: qty 180, 30d supply, fill #1
  Filled 2023-05-29: qty 180, 30d supply, fill #2
  Filled 2023-07-01: qty 180, 30d supply, fill #3

## 2023-04-03 ENCOUNTER — Other Ambulatory Visit: Payer: Self-pay

## 2023-04-04 ENCOUNTER — Other Ambulatory Visit (HOSPITAL_COMMUNITY): Payer: Self-pay

## 2023-04-18 ENCOUNTER — Ambulatory Visit
Admission: RE | Admit: 2023-04-18 | Discharge: 2023-04-18 | Disposition: A | Discharge status: Home / Self Care | Payer: Commercial Managed Care - PPO | Source: Ambulatory Visit | Attending: Internal Medicine | Admitting: Internal Medicine

## 2023-04-18 VITALS — BP 111/75 | HR 80 | Temp 99.0°F | Ht 59.25 in | Wt 111.0 lb

## 2023-04-18 DIAGNOSIS — J029 Acute pharyngitis, unspecified: Secondary | ICD-10-CM | POA: Diagnosis present

## 2023-04-18 LAB — POCT RAPID STREP A (OFFICE): Rapid Strep A Screen: NEGATIVE

## 2023-04-18 LAB — POCT MONO SCREEN (KUC): Mono, POC: NEGATIVE

## 2023-04-18 MED ORDER — AMOXICILLIN 500 MG PO CAPS: 500.0000 mg | ORAL_CAPSULE | Freq: Two times a day (BID) | ORAL | 0 refills | Status: AC

## 2023-04-18 NOTE — ED Triage Notes (Signed)
Patient presents with sore throat since 03/30/2023. States her son testing positive for Strep on yesterday. Treated with OTC meds and hot tea.

## 2023-04-18 NOTE — Discharge Instructions (Signed)
Strep and rapid mono negative.  Throat culture pending.  I have prescribed an antibiotic to take for symptoms.  Follow-up if any symptoms persist or worsen.

## 2023-04-18 NOTE — ED Provider Notes (Signed)
[facility]    CSN: [id number] Arrival date & time: 04/18/23  1050      History   Chief Complaint Chief Complaint  Patient presents with   Sore Throat    Strep possibly - Entered by patient    HPI Lorraine West is a 42 y.o. female.   Patient presents with sore throat that has been present since 03/30/2023.  She was seen on 9/2 and tested negative for strep throat.  She was advised of supportive care with minimal improvement in symptoms.  She states that she has had some drainage to the back of her throat but otherwise denies any other symptoms or fever.  Reports that her son recently tested positive for strep throat.  Has been taking over-the-counter medications with minimal improvement.   Sore Throat    Past Medical History:  Diagnosis Date   Alpha thalassemia (HCC)    Asthma    BRCA gene mutation negative in female    second time- she was tested negative when she was 42 years old   BRCA gene mutation positive in female    1st testing was positive when she was 42 years old   Heart murmur    MVP   Migraine     Patient Active Problem List   Diagnosis Date Noted   Chronic migraine without aura without status migrainosus, not intractable 05/19/2018   Genetic testing 07/14/2016   Family history of breast cancer in female 06/18/2016   Vaginal delivery 03/08/2016   Second-degree perineal laceration, with delivery 03/08/2016   Alpha thalassemia (HCC) 03/08/2016    Past Surgical History:  Procedure Laterality Date   BREAST BIOPSY Right 06/10/2022   [place] RT BREAST BX W LOC DEV 1ST LESION IMG BX SPEC US GUIDE 06/10/2022 [facility]   NO PAST SURGERIES      OB History     Gravida  3   Para  3   Term  3   Preterm  0   AB  0   Living  3      SAB  0   IAB  0   Ectopic  0   Multiple  0   Live Births  3            Home Medications    Prior to Admission medications   Medication Sig Start Date End Date Taking? Authorizing  Provider  amoxicillin (AMOXIL) 500 MG capsule Take 1 capsule (500 mg total) by mouth 2 (two) times daily for 10 days. 04/18/23 04/28/23 Yes , Acie Fredrickson, FNP  dicyclomine (BENTYL) 20 MG tablet Take 1 tablet (20 mg total) by mouth 2 (two) times daily. 12/16/22   Sabas Sous, MD  hydrOXYzine (ATARAX) 25 MG tablet Take 25 mg by mouth 2 (two) times daily as needed. Patient not taking: Reported on 01/08/2023 12/01/22   [provider]  ibuprofen (ADVIL) 800 MG tablet Take 1 tablet (800 mg total) by mouth every 8 (eight) hours as needed (pain). 11/21/22   Zenia Resides, MD  mesalamine (PENTASA) 500 MG CR capsule Take 2 capsules (1,000 mg total) by mouth 3 (three) times daily. 04/02/23     pantoprazole (PROTONIX) 40 MG tablet Take 1 tablet (40 mg total) by mouth daily. 02/26/21     PRESCRIPTION MEDICATION IUD    [provider]    Family History Family History  Problem Relation Age of Onset   Breast cancer Mother 30       d.  4y   Migraines Mother    Diabetes Father    Cervical cancer Maternal Aunt        d. 62y   Colon cancer Maternal Grandmother        dx 82-83y   Heart disease Maternal Grandfather    Heart Problems Maternal Grandfather    Cancer Maternal Grandfather        NOS cancer   Kidney cancer Paternal Grandmother        dx unspecified age   Lung cancer Paternal Grandfather        dx unspecified age   Brain cancer Cousin 64       maternal 1st cousin d. 2y; NOS brain tumor   Breast cancer Cousin 69       maternal 1st cousin   Prostate cancer Cousin 29       maternal 1st cousin d. 25y   Breast cancer Cousin 46    Social History Social History   Tobacco Use   Smoking status: Never   Smokeless tobacco: Never  Vaping Use   Vaping status: Never Used  Substance Use Topics   Alcohol use: No   Drug use: Never     Allergies   Gluten meal   Review of Systems Review of Systems Per HPI  Physical Exam Triage Vital Signs ED Triage Vitals   Encounter Vitals Group     BP 04/18/23 1104 111/75     Systolic BP Percentile --      Diastolic BP Percentile --      Pulse Rate 04/18/23 1104 80     Resp --      Temp 04/18/23 1104 99 F (37.2 C)     Temp Source 04/18/23 1104 Oral     SpO2 04/18/23 1104 97 %     Weight 04/18/23 1106 111 lb (50.3 kg)     Height 04/18/23 1106 4' 11.25" (1.505 m)     Head Circumference --      Peak Flow --      Pain Score --      Pain Loc --      Pain Education --      Exclude from Growth Chart --    No data found.  Updated Vital Signs BP 111/75 (BP Location: Left Arm)   Pulse 80   Temp 99 F (37.2 C) (Oral)   Ht 4' 11.25" (1.505 m)   Wt 111 lb (50.3 kg)   SpO2 97%   BMI 22.23 kg/m   Visual Acuity Right Eye Distance:   Left Eye Distance:   Bilateral Distance:    Right Eye Near:   Left Eye Near:    Bilateral Near:     Physical Exam Constitutional:      General: She is not in acute distress.    Appearance: Normal appearance. She is not toxic-appearing or diaphoretic.  HENT:     Head: Normocephalic and atraumatic.     Right Ear: Tympanic membrane and ear canal normal.     Left Ear: Tympanic membrane and ear canal normal.     Nose: Congestion present.     Mouth/Throat:     Mouth: Mucous membranes are moist.     Pharynx: Posterior oropharyngeal erythema present.  Eyes:     Extraocular Movements: Extraocular movements intact.     Conjunctiva/sclera: Conjunctivae normal.     Pupils: Pupils are equal, round, and reactive to light.  Cardiovascular:     Rate and Rhythm: Normal rate and regular  rhythm.     Pulses: Normal pulses.     Heart sounds: Normal heart sounds.  Pulmonary:     Effort: Pulmonary effort is normal. No respiratory distress.     Breath sounds: Normal breath sounds. No stridor. No wheezing, rhonchi or rales.  Abdominal:     General: Abdomen is flat. Bowel sounds are normal.     Palpations: Abdomen is soft.  Musculoskeletal:        General: Normal range of  motion.     Cervical back: Normal range of motion.  Skin:    General: Skin is warm and dry.  Neurological:     General: No focal deficit present.     Mental Status: She is alert and oriented to person, place, and time. Mental status is at baseline.  Psychiatric:        Mood and Affect: Mood normal.        Behavior: Behavior normal.      UC Treatments / Results  Labs (all labs ordered are listed, but only abnormal results are displayed) Labs Reviewed  CULTURE, GROUP A STREP [facility])  POCT RAPID STREP A (OFFICE)  POCT MONO SCREEN ([facility])    EKG   Radiology No results found.  Procedures Procedures (including critical care time)  Medications Ordered in UC Medications - No data to display  Initial Impression / Assessment and Plan / UC Course  I have reviewed the triage vital signs and the nursing notes.  Pertinent labs & imaging results that were available during my care of the patient were reviewed by me and considered in my medical decision making (see chart for details).     Rapid strep was again negative.  Throat culture pending.  Rapid mono negative.  Given patient has persistent sore throat and has had exposure to strep throat, will opt to treat with amoxicillin to see if this will be helpful.  Advised follow-up if any symptoms persist or worsen.  Patient verbalized understanding and was agreeable with plan. Final Clinical Impressions(s) / UC Diagnoses   Final diagnoses:  Acute pharyngitis, unspecified etiology     Discharge Instructions      Strep and rapid mono negative.  Throat culture pending.  I have prescribed an antibiotic to take for symptoms.  Follow-up if any symptoms persist or worsen.     ED Prescriptions     Medication Sig Dispense Auth. Provider   amoxicillin (AMOXIL) 500 MG capsule Take 1 capsule (500 mg total) by mouth 2 (two) times daily for 10 days. 20 capsule Gustavus Bryant, [place]      PDMP not reviewed this encounter.   Gustavus Bryant,  [place] 04/18/23 1239

## 2023-04-21 LAB — CULTURE, GROUP A STREP (THRC)

## 2023-04-29 ENCOUNTER — Other Ambulatory Visit (HOSPITAL_COMMUNITY): Payer: Self-pay

## 2023-04-29 ENCOUNTER — Other Ambulatory Visit: Payer: Self-pay

## 2023-05-26 ENCOUNTER — Other Ambulatory Visit (HOSPITAL_COMMUNITY): Payer: Self-pay

## 2023-05-26 MED ORDER — MONTELUKAST SODIUM 10 MG PO TABS
10.0000 mg | ORAL_TABLET | Freq: Every day | ORAL | 5 refills | Status: DC
  Filled 2023-05-26: qty 30, 30d supply, fill #0
  Filled 2023-06-24: qty 30, 30d supply, fill #1
  Filled 2023-07-24: qty 30, 30d supply, fill #2
  Filled 2023-09-03: qty 30, 30d supply, fill #3
  Filled 2023-10-20: qty 30, 30d supply, fill #4
  Filled 2023-12-04: qty 30, 30d supply, fill #5

## 2023-05-29 ENCOUNTER — Other Ambulatory Visit: Payer: Self-pay

## 2023-05-30 ENCOUNTER — Other Ambulatory Visit (HOSPITAL_COMMUNITY): Payer: Self-pay

## 2023-06-02 ENCOUNTER — Other Ambulatory Visit (HOSPITAL_COMMUNITY): Payer: Self-pay

## 2023-06-26 ENCOUNTER — Other Ambulatory Visit (HOSPITAL_COMMUNITY): Payer: Self-pay

## 2023-06-27 ENCOUNTER — Other Ambulatory Visit: Payer: Self-pay

## 2023-07-01 ENCOUNTER — Other Ambulatory Visit (HOSPITAL_COMMUNITY): Payer: Self-pay

## 2023-07-01 MED ORDER — OLOPATADINE HCL 0.6 % NA SOLN
2.0000 | Freq: Two times a day (BID) | NASAL | 5 refills | Status: AC
  Filled 2023-07-01: qty 30.5, 30d supply, fill #0

## 2023-07-01 MED ORDER — LEVOCETIRIZINE DIHYDROCHLORIDE 5 MG PO TABS
5.0000 mg | ORAL_TABLET | Freq: Every evening | ORAL | 5 refills | Status: AC
Start: 2023-07-01 — End: ?
  Filled 2023-07-01: qty 30, 30d supply, fill #0
  Filled 2023-07-24: qty 30, 30d supply, fill #1
  Filled 2023-09-03: qty 30, 30d supply, fill #2
  Filled 2023-10-20: qty 30, 30d supply, fill #3
  Filled 2023-12-04: qty 30, 30d supply, fill #4
  Filled 2024-02-01: qty 30, 30d supply, fill #5

## 2023-07-02 ENCOUNTER — Other Ambulatory Visit (HOSPITAL_COMMUNITY): Payer: Self-pay

## 2023-07-14 ENCOUNTER — Other Ambulatory Visit: Payer: Self-pay | Admitting: Medical Genetics

## 2023-07-17 ENCOUNTER — Other Ambulatory Visit (HOSPITAL_COMMUNITY): Payer: Self-pay

## 2023-07-17 MED ORDER — COLESTIPOL HCL 1 G PO TABS
2.0000 g | ORAL_TABLET | Freq: Every day | ORAL | 0 refills | Status: DC
  Filled 2023-07-17 – 2023-07-24 (×2): qty 28, 14d supply, fill #0

## 2023-07-17 MED ORDER — MESALAMINE ER 500 MG PO CPCR
1000.0000 mg | ORAL_CAPSULE | Freq: Three times a day (TID) | ORAL | 3 refills | Status: AC
  Filled 2023-07-17 – 2023-07-27 (×3): qty 540, 90d supply, fill #0
  Filled 2024-05-03: qty 540, 90d supply, fill #1

## 2023-07-24 ENCOUNTER — Other Ambulatory Visit: Payer: Self-pay

## 2023-07-24 ENCOUNTER — Other Ambulatory Visit (HOSPITAL_COMMUNITY): Payer: Self-pay

## 2023-07-25 ENCOUNTER — Other Ambulatory Visit (HOSPITAL_COMMUNITY): Payer: Self-pay

## 2023-07-27 ENCOUNTER — Other Ambulatory Visit: Payer: Self-pay

## 2023-07-28 ENCOUNTER — Other Ambulatory Visit (HOSPITAL_COMMUNITY): Payer: Self-pay

## 2023-08-06 ENCOUNTER — Other Ambulatory Visit (HOSPITAL_COMMUNITY)
Admission: RE | Admit: 2023-08-06 | Discharge: 2023-08-06 | Disposition: A | Discharge status: Home / Self Care | Payer: Self-pay | Source: Ambulatory Visit | Attending: Medical Genetics | Admitting: Medical Genetics

## 2023-08-07 ENCOUNTER — Other Ambulatory Visit (HOSPITAL_COMMUNITY): Payer: Self-pay

## 2023-08-17 LAB — GENECONNECT MOLECULAR SCREEN: Genetic Analysis Overall Interpretation: NEGATIVE

## 2023-09-03 ENCOUNTER — Other Ambulatory Visit: Payer: Self-pay

## 2023-09-05 ENCOUNTER — Other Ambulatory Visit (HOSPITAL_COMMUNITY): Payer: Self-pay

## 2023-10-20 ENCOUNTER — Other Ambulatory Visit: Payer: Self-pay

## 2023-12-07 ENCOUNTER — Other Ambulatory Visit (HOSPITAL_COMMUNITY): Payer: Self-pay

## 2023-12-30 ENCOUNTER — Other Ambulatory Visit (HOSPITAL_COMMUNITY): Payer: Self-pay

## 2023-12-30 MED ORDER — ESCITALOPRAM OXALATE 10 MG PO TABS
10.0000 mg | ORAL_TABLET | Freq: Every day | ORAL | 5 refills | Status: AC
  Filled 2023-12-30: qty 30, 30d supply, fill #0

## 2024-01-08 ENCOUNTER — Other Ambulatory Visit (HOSPITAL_COMMUNITY): Payer: Self-pay

## 2024-01-18 ENCOUNTER — Other Ambulatory Visit: Payer: Self-pay | Admitting: Nurse Practitioner

## 2024-01-18 DIAGNOSIS — Z1231 Encounter for screening mammogram for malignant neoplasm of breast: Secondary | ICD-10-CM

## 2024-02-01 ENCOUNTER — Other Ambulatory Visit: Payer: Self-pay

## 2024-02-01 ENCOUNTER — Other Ambulatory Visit (HOSPITAL_COMMUNITY): Payer: Self-pay

## 2024-02-01 MED ORDER — MONTELUKAST SODIUM 10 MG PO TABS
10.0000 mg | ORAL_TABLET | Freq: Every day | ORAL | 5 refills | Status: AC
  Filled 2024-02-01: qty 30, 30d supply, fill #0
  Filled 2024-04-12: qty 30, 30d supply, fill #1
  Filled 2024-05-09: qty 30, 30d supply, fill #2
  Filled 2024-07-12: qty 30, 30d supply, fill #3

## 2024-02-08 ENCOUNTER — Encounter: Payer: Self-pay | Admitting: Neurology

## 2024-02-11 ENCOUNTER — Encounter: Payer: Self-pay | Admitting: Radiology

## 2024-02-11 ENCOUNTER — Ambulatory Visit
Admission: RE | Admit: 2024-02-11 | Discharge: 2024-02-11 | Disposition: A | Discharge status: Home / Self Care | Source: Ambulatory Visit | Attending: Nurse Practitioner | Admitting: Nurse Practitioner

## 2024-02-11 DIAGNOSIS — Z1231 Encounter for screening mammogram for malignant neoplasm of breast: Secondary | ICD-10-CM

## 2024-04-12 ENCOUNTER — Other Ambulatory Visit (HOSPITAL_COMMUNITY): Payer: Self-pay

## 2024-04-12 ENCOUNTER — Other Ambulatory Visit: Payer: Self-pay

## 2024-04-12 MED ORDER — LEVOCETIRIZINE DIHYDROCHLORIDE 5 MG PO TABS
5.0000 mg | ORAL_TABLET | Freq: Every day | ORAL | 3 refills | Status: AC
  Filled 2024-04-12: qty 30, 30d supply, fill #0
  Filled 2024-05-09: qty 30, 30d supply, fill #1

## 2024-04-13 ENCOUNTER — Other Ambulatory Visit (HOSPITAL_COMMUNITY): Payer: Self-pay

## 2024-04-14 ENCOUNTER — Other Ambulatory Visit (HOSPITAL_COMMUNITY): Payer: Self-pay

## 2024-04-14 MED ORDER — PENTASA 500 MG PO CPCR
1000.0000 mg | ORAL_CAPSULE | Freq: Three times a day (TID) | ORAL | 0 refills | Status: AC
  Filled 2024-04-14: qty 120, 20d supply, fill #0

## 2024-04-15 ENCOUNTER — Other Ambulatory Visit (HOSPITAL_COMMUNITY): Payer: Self-pay

## 2024-04-15 ENCOUNTER — Encounter (HOSPITAL_COMMUNITY): Payer: Self-pay

## 2024-04-18 ENCOUNTER — Other Ambulatory Visit (HOSPITAL_COMMUNITY): Payer: Self-pay

## 2024-04-18 ENCOUNTER — Other Ambulatory Visit: Payer: Self-pay

## 2024-04-21 ENCOUNTER — Other Ambulatory Visit (HOSPITAL_COMMUNITY): Payer: Self-pay

## 2024-04-28 ENCOUNTER — Other Ambulatory Visit (HOSPITAL_COMMUNITY): Payer: Self-pay

## 2024-05-03 ENCOUNTER — Other Ambulatory Visit (HOSPITAL_COMMUNITY): Payer: Self-pay

## 2024-05-06 ENCOUNTER — Other Ambulatory Visit (HOSPITAL_COMMUNITY): Payer: Self-pay

## 2024-05-09 ENCOUNTER — Other Ambulatory Visit (HOSPITAL_COMMUNITY): Payer: Self-pay

## 2024-05-09 ENCOUNTER — Other Ambulatory Visit: Payer: Self-pay

## 2024-05-09 MED ORDER — MESALAMINE 1.2 G PO TBEC
2.4000 g | DELAYED_RELEASE_TABLET | Freq: Every day | ORAL | 0 refills | Status: AC
  Filled 2024-05-09: qty 60, 30d supply, fill #0

## 2024-05-10 ENCOUNTER — Other Ambulatory Visit (HOSPITAL_COMMUNITY): Payer: Self-pay

## 2024-05-24 ENCOUNTER — Other Ambulatory Visit (HOSPITAL_COMMUNITY): Payer: Self-pay

## 2024-05-26 ENCOUNTER — Other Ambulatory Visit (HOSPITAL_COMMUNITY): Payer: Self-pay

## 2024-05-31 ENCOUNTER — Ambulatory Visit (HOSPITAL_COMMUNITY)
Admission: RE | Admit: 2024-05-31 | Discharge: 2024-05-31 | Disposition: A | Discharge status: Home / Self Care | Source: Ambulatory Visit | Attending: Student | Admitting: Student

## 2024-05-31 ENCOUNTER — Encounter (HOSPITAL_COMMUNITY): Payer: Self-pay

## 2024-05-31 VITALS — BP 125/68 | HR 78 | Temp 98.2°F | Resp 18

## 2024-05-31 DIAGNOSIS — J029 Acute pharyngitis, unspecified: Secondary | ICD-10-CM | POA: Diagnosis not present

## 2024-05-31 LAB — POCT RAPID STREP A (OFFICE): Rapid Strep A Screen: NEGATIVE

## 2024-05-31 NOTE — ED Provider Notes (Signed)
 [facility]    CSN: [id number] Arrival date & time: 05/31/24  0809      History   Chief Complaint Chief Complaint  Patient presents with   Sore Throat    Entered by patient   APPT - sore throat    HPI Lorraine West is a 43 y.o. female presenting with sore throat and neck discomfort since 05/28/2024 (3 days).  History of allergic rhinitis, alpha thalassemia.  She describes sore throat, nasal congestion, and nonproductive cough.  She notes neck pain that feels deeper than lymph nodes.  Denies nausea, vomiting, diarrhea, fevers.  Taking levocetirizine for allergies.  HPI  Past Medical History:  Diagnosis Date   Alpha thalassemia    Asthma    BRCA gene mutation negative in female    second time- she was tested negative when she was 43 years old   BRCA gene mutation positive in female    1st testing was positive when she was 43 years old   Heart murmur    MVP   Migraine     Patient Active Problem List   Diagnosis Date Noted   Chronic migraine without aura without status migrainosus, not intractable 05/19/2018   Genetic testing 07/14/2016   Family history of breast cancer in female 06/18/2016   Vaginal delivery 03/08/2016   Second-degree perineal laceration, with delivery 03/08/2016   Alpha thalassemia 03/08/2016    Past Surgical History:  Procedure Laterality Date   BREAST BIOPSY Right 06/10/2022   US  RT BREAST BX W LOC DEV 1ST LESION IMG BX SPEC US  GUIDE 06/10/2022 [facility]   NO PAST SURGERIES      OB History     Gravida  3   Para  3   Term  3   Preterm  0   AB  0   Living  3      SAB  0   IAB  0   Ectopic  0   Multiple  0   Live Births  3            Home Medications    Prior to Admission medications   Medication Sig Start Date End Date Taking? Authorizing Provider  escitalopram  (LEXAPRO ) 10 MG tablet Take 1 tablet (10 mg total) by mouth daily. Patient not taking: Reported on 05/31/2024 12/30/23     hydrOXYzine  (ATARAX) 25 MG tablet Take 25 mg by mouth 2 (two) times daily as needed. Patient not taking: Reported on 01/08/2023 12/01/22   [provider]  ibuprofen  (ADVIL ) 800 MG tablet Take 1 tablet (800 mg total) by mouth every 8 (eight) hours as needed (pain). 11/21/22   Banister, Pamela K, MD  levocetirizine (XYZAL ) 5 MG tablet Take 1 tablet (5 mg total) by mouth every evening. 07/01/23     levocetirizine (XYZAL ) 5 MG tablet Take 1 tablet (5 mg total) by mouth daily. 04/12/24     mesalamine  (LIALDA ) 1.2 g EC tablet Take 2 tablets (2.4 g total) by mouth daily with a meal. 05/09/24     mesalamine  (PENTASA ) 500 MG CR capsule Take 2 capsules (1,000 mg total) by mouth 3 (three) times daily. Patient not taking: Reported on 05/31/2024 07/17/23     mesalamine  (PENTASA ) 500 MG CR capsule Take 2 capsules (1,000 mg total) by mouth 3 (three) times daily. Patient not taking: Reported on 05/31/2024 04/14/24     montelukast  (SINGULAIR ) 10 MG tablet Take 1 tablet (10 mg total) by mouth daily. 02/01/24  Olopatadine  HCl 0.6 % SOLN Place 2 sprays into both nostrils 2 (two) times daily. 07/01/23     pantoprazole  (PROTONIX ) 40 MG tablet Take 1 tablet (40 mg total) by mouth daily. 02/26/21     PRESCRIPTION MEDICATION IUD    [provider]    Family History Family History  Problem Relation Age of Onset   Breast cancer Mother 75       d. 79y   Migraines Mother    Diabetes Father    Cervical cancer Maternal Aunt        d. 62y   Colon cancer Maternal Grandmother        dx 82-83y   Heart disease Maternal Grandfather    Heart Problems Maternal Grandfather    Cancer Maternal Grandfather        NOS cancer   Kidney cancer Paternal Grandmother        dx unspecified age   Lung cancer Paternal Grandfather        dx unspecified age   Brain cancer Cousin 10       maternal 1st cousin d. 54y; NOS brain tumor   Breast cancer Cousin 75       maternal 1st cousin   Prostate cancer Cousin 90       maternal 1st cousin  d. 25y   Breast cancer Cousin 81    Social History Social History   Tobacco Use   Smoking status: Never   Smokeless tobacco: Never  Vaping Use   Vaping status: Never Used  Substance Use Topics   Alcohol use: No   Drug use: Never     Allergies   Gluten meal   Review of Systems Review of Systems  Constitutional:  Negative for appetite change, chills and fever.  HENT:  Positive for sore throat. Negative for congestion, ear pain, rhinorrhea, sinus pressure and sinus pain.   Eyes:  Negative for redness and visual disturbance.  Respiratory:  Negative for cough, chest tightness, shortness of breath and wheezing.   Cardiovascular:  Negative for chest pain and palpitations.  Gastrointestinal:  Negative for abdominal pain, constipation, diarrhea, nausea and vomiting.  Genitourinary:  Negative for dysuria, frequency and urgency.  Musculoskeletal:  Negative for myalgias.  Neurological:  Negative for dizziness, weakness and headaches.  Psychiatric/Behavioral:  Negative for confusion.   All other systems reviewed and are negative.    Physical Exam Triage Vital Signs ED Triage Vitals  Encounter Vitals Group     BP 05/31/24 0832 125/68     Girls Systolic BP Percentile --      Girls Diastolic BP Percentile --      Boys Systolic BP Percentile --      Boys Diastolic BP Percentile --      Pulse Rate 05/31/24 0832 78     Resp 05/31/24 0832 18     Temp 05/31/24 0832 98.2 F (36.8 C)     Temp Source 05/31/24 0832 Oral     SpO2 05/31/24 0832 98 %     Weight --      Height --      Head Circumference --      Peak Flow --      Pain Score 05/31/24 0829 2     Pain Loc --      Pain Education --      Exclude from Growth Chart --    No data found.  Updated Vital Signs BP 125/68 (BP Location: Right Arm)   Pulse 78  Temp 98.2 F (36.8 C) (Oral)   Resp 18   LMP 05/14/2024 (Exact Date)   SpO2 98%   Visual Acuity Right Eye Distance:   Left Eye Distance:   Bilateral Distance:     Right Eye Near:   Left Eye Near:    Bilateral Near:     Physical Exam Vitals reviewed.  Constitutional:      General: She is not in acute distress.    Appearance: Normal appearance. She is not ill-appearing.  HENT:     Head: Normocephalic and atraumatic.     Right Ear: Tympanic membrane, ear canal and external ear normal. No tenderness. No middle ear effusion. There is no impacted cerumen. Tympanic membrane is not perforated, erythematous, retracted or bulging.     Left Ear: Tympanic membrane, ear canal and external ear normal. No tenderness.  No middle ear effusion. There is no impacted cerumen. Tympanic membrane is not perforated, erythematous, retracted or bulging.     Nose: Nose normal. No congestion.     Mouth/Throat:     Mouth: Mucous membranes are moist.     Pharynx: Uvula midline. No oropharyngeal exudate or posterior oropharyngeal erythema.     Tonsils: 1+ on the right. 1+ on the left.     Comments: Tonsils are 1+ bilaterally with minimal erythema and no exudate.  Cobblestoning posterior pharynx. Eyes:     Extraocular Movements: Extraocular movements intact.     Pupils: Pupils are equal, round, and reactive to light.  Cardiovascular:     Rate and Rhythm: Normal rate and regular rhythm.     Heart sounds: Normal heart sounds.  Pulmonary:     Effort: Pulmonary effort is normal.     Breath sounds: Normal breath sounds. No decreased breath sounds, wheezing, rhonchi or rales.  Abdominal:     Palpations: Abdomen is soft.     Tenderness: There is no abdominal tenderness. There is no guarding or rebound.  Lymphadenopathy:     Cervical: No cervical adenopathy.     Right cervical: No superficial cervical adenopathy.    Left cervical: No superficial cervical adenopathy.  Neurological:     General: No focal deficit present.     Mental Status: She is alert and oriented to person, place, and time.  Psychiatric:        Mood and Affect: Mood normal.        Behavior: Behavior  normal.        Thought Content: Thought content normal.        Judgment: Judgment normal.      UC Treatments / Results  Labs (all labs ordered are listed, but only abnormal results are displayed) Labs Reviewed  CULTURE, GROUP A STREP [facility])  POCT RAPID STREP A (OFFICE)    EKG   Radiology No results found.  Procedures Procedures (including critical care time)  Medications Ordered in UC Medications - No data to display  Initial Impression / Assessment and Plan / UC Course  I have reviewed the triage vital signs and the nursing notes.  Pertinent labs & imaging results that were available during my care of the patient were reviewed by me and considered in my medical decision making (see chart for details).     The patient is a pleasant 43 year old female presenting with allergic rhinitis.  She is afebrile and nontachycardic.  Rapid strep negative, culture sent.  She will continue over-the-counter levocetirizine, and can add Benadryl  at night as needed.  Final Clinical Impressions(s) / UC Diagnoses  Final diagnoses:  Sore throat     Discharge Instructions      -Your rapid strep test was negative -We will call with any positive results in about 3 business days and can send treatment if necessary. -We will not call with negative lab results. -Lab results will automatically go to your MyChart.    ED Prescriptions   None    PDMP not reviewed this encounter.   Arlyss Leita BRAVO, PA-C 05/31/24 [phone]

## 2024-05-31 NOTE — Discharge Instructions (Signed)
-  Your rapid strep test was negative -We will call with any positive results in about 3 business days and can send treatment if necessary. -We will not call with negative lab results. -Lab results will automatically go to your MyChart.

## 2024-05-31 NOTE — ED Triage Notes (Signed)
 Pt c/o sore throat and swollen lymph nodes since Saturday. C/o itchy feeling. States takes allergy meds daily.

## 2024-06-02 LAB — CULTURE, GROUP A STREP (THRC)

## 2024-06-03 ENCOUNTER — Ambulatory Visit (HOSPITAL_COMMUNITY): Payer: Self-pay

## 2024-06-15 ENCOUNTER — Other Ambulatory Visit (HOSPITAL_COMMUNITY): Payer: Self-pay

## 2024-06-15 MED ORDER — FLUTICASONE PROPIONATE 50 MCG/ACT NA SUSP
2.0000 | Freq: Every day | NASAL | 11 refills | Status: AC
  Filled 2024-06-15: qty 16, 30d supply, fill #0
  Filled 2024-07-12: qty 16, 30d supply, fill #1
  Filled 2024-08-11: qty 16, 30d supply, fill #2

## 2024-06-15 MED ORDER — LEVOCETIRIZINE DIHYDROCHLORIDE 5 MG PO TABS
5.0000 mg | ORAL_TABLET | Freq: Every evening | ORAL | 11 refills | Status: AC
  Filled 2024-06-15: qty 30, 30d supply, fill #0
  Filled 2024-07-12: qty 30, 30d supply, fill #1
  Filled 2024-08-11: qty 30, 30d supply, fill #2

## 2024-06-15 MED ORDER — ALBUTEROL SULFATE HFA 108 (90 BASE) MCG/ACT IN AERS
2.0000 | INHALATION_SPRAY | RESPIRATORY_TRACT | 0 refills | Status: AC
  Filled 2024-06-15: qty 6.7, 17d supply, fill #0

## 2024-06-15 MED ORDER — IPRATROPIUM BROMIDE 0.06 % NA SOLN
2.0000 | Freq: Three times a day (TID) | NASAL | 1 refills | Status: AC | PRN
  Filled 2024-06-15: qty 15, 25d supply, fill #0
  Filled 2024-08-23: qty 15, 25d supply, fill #1

## 2024-06-21 ENCOUNTER — Other Ambulatory Visit (HOSPITAL_COMMUNITY): Payer: Self-pay

## 2024-06-21 MED ORDER — TRIAMCINOLONE ACETONIDE 0.1 % EX CREA
TOPICAL_CREAM | CUTANEOUS | 0 refills | Status: AC
  Filled 2024-06-21: qty 30, 30d supply, fill #0

## 2024-07-12 ENCOUNTER — Other Ambulatory Visit (HOSPITAL_COMMUNITY): Payer: Self-pay

## 2024-07-14 ENCOUNTER — Other Ambulatory Visit: Payer: Self-pay | Admitting: Nurse Practitioner

## 2024-07-14 DIAGNOSIS — R1900 Intra-abdominal and pelvic swelling, mass and lump, unspecified site: Secondary | ICD-10-CM

## 2024-07-14 DIAGNOSIS — R102 Pelvic and perineal pain unspecified side: Secondary | ICD-10-CM

## 2024-07-25 ENCOUNTER — Other Ambulatory Visit: Payer: Self-pay | Admitting: Nurse Practitioner

## 2024-07-25 DIAGNOSIS — Z803 Family history of malignant neoplasm of breast: Secondary | ICD-10-CM

## 2024-07-25 DIAGNOSIS — Z9189 Other specified personal risk factors, not elsewhere classified: Secondary | ICD-10-CM

## 2024-07-25 DIAGNOSIS — Z1501 Genetic susceptibility to malignant neoplasm of breast: Secondary | ICD-10-CM

## 2024-07-27 ENCOUNTER — Ambulatory Visit
Admission: RE | Admit: 2024-07-27 | Discharge: 2024-07-27 | Disposition: A | Discharge status: Home / Self Care | Source: Ambulatory Visit | Attending: Nurse Practitioner | Admitting: Nurse Practitioner

## 2024-07-27 DIAGNOSIS — R102 Pelvic and perineal pain unspecified side: Secondary | ICD-10-CM

## 2024-07-27 DIAGNOSIS — R1900 Intra-abdominal and pelvic swelling, mass and lump, unspecified site: Secondary | ICD-10-CM

## 2024-08-02 ENCOUNTER — Other Ambulatory Visit (HOSPITAL_COMMUNITY): Payer: Self-pay

## 2024-08-02 MED ORDER — PREDNISONE 10 MG (21) PO TBPK
ORAL_TABLET | ORAL | 0 refills | Status: AC
  Filled 2024-08-02: qty 21, 6d supply, fill #0

## 2024-08-02 MED ORDER — CYCLOBENZAPRINE HCL 5 MG PO TABS
5.0000 mg | ORAL_TABLET | ORAL | 0 refills | Status: AC
  Filled 2024-08-02: qty 30, 10d supply, fill #0

## 2024-08-05 ENCOUNTER — Other Ambulatory Visit (HOSPITAL_COMMUNITY): Payer: Self-pay

## 2024-08-05 MED ORDER — PREDNISONE 10 MG PO TABS
10.0000 mg | ORAL_TABLET | Freq: Three times a day (TID) | ORAL | 0 refills | Status: AC
  Filled 2024-08-05: qty 42, 14d supply, fill #0

## 2024-08-11 ENCOUNTER — Other Ambulatory Visit (HOSPITAL_COMMUNITY): Payer: Self-pay

## 2024-08-11 NOTE — Progress Notes (Signed)
" °  Cardiology Office Note:  .   Date:  08/11/2024  ID:  Lorraine West, DOB 1980/08/27, MRN [id number] PCP: Verdia Lombard, MD  [facility] Providers Cardiologist:  Georganna Archer, MD { Chief Complaint: No chief complaint on file.   History of Present Illness: .    Lorraine West is a 44 y.o. female with a PMH of Crohn's disease migraines who presents as a new patient referral by Dr. Heide for the evaluation of a valvular disorder.   Pre-Chart Notes: - Apparently was told while in [place] that she had valvular regurgitation and was referred by her orthopedic surgeon for evaluation  Discussed the use of AI scribe software for clinical note transcription with the patient, who gave verbal consent to proceed.  History of Present Illness       Studies Reviewed: SABRA    EKG: ***           Results   Risk Assessment/Calculations:    {Does this patient have ATRIAL FIBRILLATION?[phone]} No BP recorded.  {Refresh Note OR Click here to enter BP  :1}***        Physical Exam:    VS:  There were no vitals taken for this visit. ***    Wt Readings from Last 3 Encounters:  04/18/23 111 lb (50.3 kg)  01/08/23 121 lb 6.4 oz (55.1 kg)  12/15/22 125 lb (56.7 kg)     GEN: Well nourished, well developed, in no acute distress NECK: No JVD; No carotid bruits CARDIAC: ***RRR, no murmurs, rubs, gallops RESPIRATORY:  Clear to auscultation without rales, wheezing or rhonchi  ABDOMEN: Soft, non-tender, non-distended, normal bowel sounds EXTREMITIES:  Warm and well perfused, no edema; No deformity, 2+ radial pulses PSYCH: Normal mood and affect   ASSESSMENT AND PLAN: .        {Are you ordering a CV Procedure (e.g. stress test, cath, DCCV, TEE, etc)?   Press F2        :[id number]}   This note was written with the assistance of a dictation microphone or AI dictation software. Please excuse any typos or grammatical errors.   Signed, Georganna Archer, MD  08/11/2024  8:45 PM     [facility] "

## 2024-08-12 ENCOUNTER — Ambulatory Visit
Attending: Student in an Organized Health Care Education/Training Program | Admitting: Student in an Organized Health Care Education/Training Program

## 2024-08-12 ENCOUNTER — Encounter: Payer: Self-pay | Admitting: Student in an Organized Health Care Education/Training Program

## 2024-08-12 VITALS — BP 108/72 | HR 93 | Ht 59.0 in | Wt 119.3 lb

## 2024-08-12 DIAGNOSIS — I341 Nonrheumatic mitral (valve) prolapse: Secondary | ICD-10-CM | POA: Diagnosis not present

## 2024-08-12 DIAGNOSIS — R011 Cardiac murmur, unspecified: Secondary | ICD-10-CM

## 2024-08-12 DIAGNOSIS — I38 Endocarditis, valve unspecified: Secondary | ICD-10-CM | POA: Diagnosis not present

## 2024-08-12 DIAGNOSIS — G629 Polyneuropathy, unspecified: Secondary | ICD-10-CM

## 2024-08-12 NOTE — Patient Instructions (Signed)
 Testing/Procedures: Echocardiogram  Your physician has requested that you have an echocardiogram. Echocardiography is a painless test that uses sound waves to create images of your heart. It provides your doctor with information about the size and shape of your heart and how well your hearts chambers and valves are working. This procedure takes approximately one hour. There are no restrictions for this procedure. Please do NOT wear cologne, perfume, aftershave, or lotions (deodorant is allowed). Please arrive 15 minutes prior to your appointment time.  Please note: We ask at that you not bring children with you during ultrasound (echo/ vascular) testing. Due to room size and safety concerns, children are not allowed in the ultrasound rooms during exams. Our front office staff cannot provide observation of children in our lobby area while testing is being conducted. An adult accompanying a patient to their appointment will only be allowed in the ultrasound room at the discretion of the ultrasound technician under special circumstances. We apologize for any inconvenience.   NEUROLOGY REFERRAL TO [facility]  Follow-Up: At [facility], you and your health needs are our priority.  As part of our continuing mission to provide you with exceptional heart care, our providers are all part of one team.  This team includes your primary Cardiologist (physician) and Advanced Practice Providers or APPs (Physician Assistants and Nurse Practitioners) who all work together to provide you with the care you need, when you need it.  Your next appointment:   6 month(s)  Provider:   Georganna Archer, MD

## 2024-08-23 ENCOUNTER — Encounter: Payer: Self-pay | Admitting: Nurse Practitioner

## 2024-08-25 ENCOUNTER — Ambulatory Visit
Admission: RE | Admit: 2024-08-25 | Discharge: 2024-08-25 | Disposition: A | Discharge status: Home / Self Care | Source: Ambulatory Visit | Attending: Nurse Practitioner | Admitting: Nurse Practitioner

## 2024-08-25 DIAGNOSIS — Z803 Family history of malignant neoplasm of breast: Secondary | ICD-10-CM

## 2024-08-25 DIAGNOSIS — Z1501 Genetic susceptibility to malignant neoplasm of breast: Secondary | ICD-10-CM

## 2024-08-25 DIAGNOSIS — Z9189 Other specified personal risk factors, not elsewhere classified: Secondary | ICD-10-CM

## 2024-08-25 MED ORDER — GADOPICLENOL 0.5 MMOL/ML IV SOLN
5.0000 mL | Freq: Once | INTRAVENOUS | Status: AC | PRN
  Administered 2024-08-25: 5 mL via INTRAVENOUS

## 2024-09-01 ENCOUNTER — Encounter: Payer: Self-pay | Admitting: Student in an Organized Health Care Education/Training Program

## 2024-09-07 ENCOUNTER — Institutional Professional Consult (permissible substitution): Admitting: Neurology

## 2024-09-15 ENCOUNTER — Ambulatory Visit (HOSPITAL_COMMUNITY)
